# Patient Record
Sex: Female | Born: 1970 | Race: White | Hispanic: No | Marital: Married | Smoking: Never smoker
Health system: Southern US, Community
[De-identification: ages and names within clinical notes are randomized; demographics above are authoritative.]

## PROBLEM LIST (undated history)

## (undated) DIAGNOSIS — E785 Hyperlipidemia, unspecified: Secondary | ICD-10-CM

## (undated) DIAGNOSIS — J189 Pneumonia, unspecified organism: Secondary | ICD-10-CM

## (undated) DIAGNOSIS — Z9289 Personal history of other medical treatment: Secondary | ICD-10-CM

## (undated) DIAGNOSIS — T8859XA Other complications of anesthesia, initial encounter: Secondary | ICD-10-CM

## (undated) DIAGNOSIS — F988 Other specified behavioral and emotional disorders with onset usually occurring in childhood and adolescence: Secondary | ICD-10-CM

## (undated) DIAGNOSIS — I10 Essential (primary) hypertension: Secondary | ICD-10-CM

## (undated) DIAGNOSIS — K589 Irritable bowel syndrome without diarrhea: Secondary | ICD-10-CM

## (undated) DIAGNOSIS — Z87898 Personal history of other specified conditions: Secondary | ICD-10-CM

## (undated) HISTORY — PX: COLONOSCOPY: SHX174

## (undated) HISTORY — DX: Hyperlipidemia, unspecified: E78.5

## (undated) HISTORY — DX: Other specified behavioral and emotional disorders with onset usually occurring in childhood and adolescence: F98.8

## (undated) HISTORY — DX: Irritable bowel syndrome, unspecified: K58.9

## (undated) HISTORY — DX: Essential (primary) hypertension: I10

## (undated) HISTORY — PX: HYSTEROSCOPY WITH D & C: SHX1775

---

## 2012-11-23 ENCOUNTER — Ambulatory Visit: Payer: Self-pay | Admitting: Obstetrics and Gynecology

## 2013-04-06 DIAGNOSIS — N840 Polyp of corpus uteri: Secondary | ICD-10-CM

## 2013-04-06 DIAGNOSIS — N63 Unspecified lump in unspecified breast: Secondary | ICD-10-CM

## 2013-04-06 HISTORY — DX: Unspecified lump in unspecified breast: N63.0

## 2013-04-06 HISTORY — DX: Polyp of corpus uteri: N84.0

## 2013-05-29 ENCOUNTER — Ambulatory Visit: Payer: Self-pay | Admitting: Obstetrics and Gynecology

## 2014-02-21 ENCOUNTER — Ambulatory Visit: Payer: Self-pay | Admitting: Obstetrics and Gynecology

## 2015-07-26 ENCOUNTER — Other Ambulatory Visit: Payer: Self-pay | Admitting: Obstetrics & Gynecology

## 2015-07-26 DIAGNOSIS — Z1239 Encounter for other screening for malignant neoplasm of breast: Secondary | ICD-10-CM

## 2015-08-05 ENCOUNTER — Ambulatory Visit: Admission: RE | Admit: 2015-08-05 | Payer: Self-pay | Source: Ambulatory Visit

## 2015-08-09 ENCOUNTER — Ambulatory Visit
Admission: RE | Admit: 2015-08-09 | Discharge: 2015-08-09 | Disposition: A | Discharge status: Home / Self Care | Payer: 59 | Source: Ambulatory Visit | Attending: Obstetrics & Gynecology | Admitting: Obstetrics & Gynecology

## 2015-08-09 DIAGNOSIS — Z1231 Encounter for screening mammogram for malignant neoplasm of breast: Secondary | ICD-10-CM | POA: Diagnosis present

## 2015-08-09 DIAGNOSIS — Z1239 Encounter for other screening for malignant neoplasm of breast: Secondary | ICD-10-CM

## 2015-08-14 ENCOUNTER — Other Ambulatory Visit: Payer: Self-pay | Admitting: Obstetrics & Gynecology

## 2015-08-14 DIAGNOSIS — R928 Other abnormal and inconclusive findings on diagnostic imaging of breast: Secondary | ICD-10-CM

## 2015-08-26 ENCOUNTER — Ambulatory Visit
Admission: RE | Admit: 2015-08-26 | Discharge: 2015-08-26 | Disposition: A | Discharge status: Home / Self Care | Payer: 59 | Source: Ambulatory Visit | Attending: Obstetrics & Gynecology | Admitting: Obstetrics & Gynecology

## 2015-08-26 DIAGNOSIS — N6489 Other specified disorders of breast: Secondary | ICD-10-CM | POA: Diagnosis present

## 2015-08-26 DIAGNOSIS — R928 Other abnormal and inconclusive findings on diagnostic imaging of breast: Secondary | ICD-10-CM

## 2016-08-03 ENCOUNTER — Other Ambulatory Visit: Payer: Self-pay | Admitting: Obstetrics & Gynecology

## 2016-08-03 DIAGNOSIS — Z1231 Encounter for screening mammogram for malignant neoplasm of breast: Secondary | ICD-10-CM

## 2016-08-28 ENCOUNTER — Ambulatory Visit
Admission: RE | Admit: 2016-08-28 | Discharge: 2016-08-28 | Disposition: A | Discharge status: Home / Self Care | Payer: 59 | Source: Ambulatory Visit | Attending: Obstetrics & Gynecology | Admitting: Obstetrics & Gynecology

## 2016-08-28 DIAGNOSIS — Z1231 Encounter for screening mammogram for malignant neoplasm of breast: Secondary | ICD-10-CM | POA: Diagnosis present

## 2017-08-27 ENCOUNTER — Other Ambulatory Visit: Payer: Self-pay | Admitting: Obstetrics & Gynecology

## 2017-08-27 DIAGNOSIS — Z1231 Encounter for screening mammogram for malignant neoplasm of breast: Secondary | ICD-10-CM

## 2017-09-09 ENCOUNTER — Ambulatory Visit
Admission: RE | Admit: 2017-09-09 | Discharge: 2017-09-09 | Disposition: A | Discharge status: Home / Self Care | Payer: Managed Care, Other (non HMO) | Source: Ambulatory Visit | Attending: Obstetrics & Gynecology | Admitting: Obstetrics & Gynecology

## 2017-09-09 DIAGNOSIS — Z1231 Encounter for screening mammogram for malignant neoplasm of breast: Secondary | ICD-10-CM | POA: Insufficient documentation

## 2017-12-26 ENCOUNTER — Emergency Department
Admission: EM | Admit: 2017-12-26 | Discharge: 2017-12-26 | Disposition: A | Discharge status: Home / Self Care | Payer: Managed Care, Other (non HMO) | Attending: Emergency Medicine | Admitting: Emergency Medicine

## 2017-12-26 ENCOUNTER — Other Ambulatory Visit: Payer: Self-pay

## 2017-12-26 DIAGNOSIS — Y9301 Activity, walking, marching and hiking: Secondary | ICD-10-CM | POA: Diagnosis not present

## 2017-12-26 DIAGNOSIS — W458XXA Other foreign body or object entering through skin, initial encounter: Secondary | ICD-10-CM | POA: Insufficient documentation

## 2017-12-26 DIAGNOSIS — Y999 Unspecified external cause status: Secondary | ICD-10-CM | POA: Diagnosis not present

## 2017-12-26 DIAGNOSIS — S91331A Puncture wound without foreign body, right foot, initial encounter: Secondary | ICD-10-CM | POA: Diagnosis not present

## 2017-12-26 DIAGNOSIS — Y929 Unspecified place or not applicable: Secondary | ICD-10-CM | POA: Insufficient documentation

## 2017-12-26 DIAGNOSIS — Z23 Encounter for immunization: Secondary | ICD-10-CM | POA: Diagnosis not present

## 2017-12-26 MED ORDER — BACITRACIN-NEOMYCIN-POLYMYXIN 400-5-5000 EX OINT
TOPICAL_OINTMENT | Freq: Once | CUTANEOUS | Status: AC
  Administered 2017-12-26: 15:00:00 via TOPICAL

## 2017-12-26 MED ORDER — BACITRACIN-NEOMYCIN-POLYMYXIN 400-5-5000 EX OINT
TOPICAL_OINTMENT | CUTANEOUS | Status: AC
  Filled 2017-12-26: qty 1

## 2017-12-26 MED ORDER — TETANUS-DIPHTH-ACELL PERTUSSIS 5-2.5-18.5 LF-MCG/0.5 IM SUSP
0.5000 mL | Freq: Once | INTRAMUSCULAR | Status: AC
  Administered 2017-12-26: 0.5 mL via INTRAMUSCULAR
  Filled 2017-12-26: qty 0.5

## 2017-12-26 MED ORDER — CIPROFLOXACIN HCL 500 MG PO TABS: 500.0000 mg | ORAL_TABLET | Freq: Two times a day (BID) | ORAL | 0 refills | Status: DC

## 2017-12-26 NOTE — Discharge Instructions (Addendum)
Follow-up with your regular doctor if there is any sign of infection.  Take the medication as prescribed.  You may soak your foot in warm water with Epson salts.  Clean the wound daily with soap and water.  Return to the emergency department if you are worsening

## 2017-12-26 NOTE — ED Triage Notes (Signed)
Pt states she stepped on a piece of rebarb that went through her flip flop and into the arch of her right foot. Bleeding controlled.

## 2017-12-26 NOTE — ED Notes (Signed)
Neosporin dressing applied  

## 2017-12-26 NOTE — ED Provider Notes (Signed)
[facility] Emergency Department Provider Note  ____________________________________________   First MD Initiated Contact with Patient 12/26/17 1503     (approximate)  I have reviewed the triage vital signs and the nursing notes.   HISTORY  Chief Complaint Puncture Wound    HPI Dawn Singh is a 47 y.o. female's emergency department with a puncture wound to the right foot.  She states her tetanus is unknown.  She states that she stepped on a spike that was in the ground that had held a bench.  The spike went through the rubber sole of her Birkenstock and into her foot.  She denies any other injuries    History reviewed. No pertinent past medical history.  There are no active problems to display for this patient.   History reviewed. No pertinent surgical history.  Prior to Admission medications   Medication Sig Start Date End Date Taking? Authorizing Provider  ciprofloxacin (CIPRO) 500 MG tablet Take 1 tablet (500 mg total) by mouth 2 (two) times daily. 12/26/17   Faythe GheeFisher, Avram Danielson W, PA-C    Allergies Amoxicillin  Family History  Problem Relation Age of Onset  . Breast cancer Neg Hx     Social History Social History   Tobacco Use  . Smoking status: Never Smoker  . Smokeless tobacco: Never Used  Substance Use Topics  . Alcohol use: Not Currently  . Drug use: Not Currently    Review of Systems  Constitutional: No fever/chills Eyes: No visual changes. ENT: No sore throat. Respiratory: Denies cough Genitourinary: Negative for dysuria. Musculoskeletal: Negative for back pain. Skin: Negative for rash.  Positive for plantar puncture wound through a shoe    ____________________________________________   PHYSICAL EXAM:  VITAL SIGNS: ED Triage Vitals  Enc Vitals Group     BP 12/26/17 1445 (!) 145/79     Pulse Rate 12/26/17 1445 89     Resp 12/26/17 1445 16     Temp 12/26/17 1445 99.5 F (37.5 C)     Temp Source 12/26/17 1445  Oral     SpO2 12/26/17 1445 97 %     Weight --      Height --      Head Circumference --      Peak Flow --      Pain Score 12/26/17 1440 5     Pain Loc --      Pain Edu? --      Excl. in GC? --     Constitutional: Alert and oriented. Well appearing and in no acute distress. Eyes: Conjunctivae are normal.  Head: Atraumatic. Nose: No congestion/rhinnorhea. Mouth/Throat: Mucous membranes are moist.   Neck:  supple no lymphadenopathy noted Cardiovascular: Normal rate, regular rhythm GU: deferred Musculoskeletal: FROM all extremities, warm and well perfused, positive for a puncture wound to the plantar surface of the right foot.  No foreign body is noted.  Neurovascular is intact Neurologic:  Normal speech and language.  Skin:  Skin is warm, dry , positive for a puncture wound. No rash noted. Psychiatric: Mood and affect are normal. Speech and behavior are normal.  ____________________________________________   LABS (all labs ordered are listed, but only abnormal results are displayed)  Labs Reviewed - No data to display ____________________________________________   ____________________________________________  RADIOLOGY    ____________________________________________   PROCEDURES  Procedure(s) performed: Tdap given Procedures    ____________________________________________   INITIAL IMPRESSION / ASSESSMENT AND PLAN / ED COURSE  Pertinent labs & imaging results that were available  during my care of the patient were reviewed by me and considered in my medical decision making (see chart for details).   Patient is a 47 year old female presents emergency department after a puncture wound to the plantar surface of the right foot while wearing sandals.  On physical exam patient does have a puncture wound.  The shoes she was wearing has a rubber sole with cork base.  No foreign body is noted.  The patient was given a Tdap as she is unsure of her last tetanus.  She  was given a prescription for Cipro due to the concerns of Pseudomonas.  She is to wash the foot daily with soap and water.  She can apply a small amount of antibiotic ointment if desired.  Take the medication as prescribed.  Follow-up with her regular doctor if any sign of infection.  Return emergency department worsening.  She states she understands will comply.  She was discharged in stable condition     As part of my medical decision making, I reviewed the following data within the electronic MEDICAL RECORD NUMBER Nursing notes reviewed and incorporated, Old chart reviewed, Notes from prior ED visits and [place] Controlled Substance Database  ____________________________________________   FINAL CLINICAL IMPRESSION(S) / ED DIAGNOSES  Final diagnoses:  Puncture wound of plantar aspect of foot without complication, right, initial encounter      NEW MEDICATIONS STARTED DURING THIS VISIT:  New Prescriptions   CIPROFLOXACIN (CIPRO) 500 MG TABLET    Take 1 tablet (500 mg total) by mouth 2 (two) times daily.     Note:  This document was prepared using Dragon voice recognition software and may include unintentional dictation errors.    Faythe Ghee, PA-C 12/26/17 1527    Merrily Brittle, MD 12/27/17 [phone]

## 2017-12-26 NOTE — ED Notes (Signed)
Pt stepped on a piece of metal  4 hours ago  Puncture wound  r foot   wound was  Cleaned at the site   Pain is present  Unknown as to last tetanus  Shot but it has been over six  Years

## 2018-10-24 ENCOUNTER — Other Ambulatory Visit: Payer: Self-pay | Admitting: Obstetrics & Gynecology

## 2018-10-24 DIAGNOSIS — Z1231 Encounter for screening mammogram for malignant neoplasm of breast: Secondary | ICD-10-CM

## 2018-12-02 ENCOUNTER — Ambulatory Visit
Admission: RE | Admit: 2018-12-02 | Discharge: 2018-12-02 | Disposition: A | Discharge status: Home / Self Care | Payer: Managed Care, Other (non HMO) | Source: Ambulatory Visit | Attending: Obstetrics & Gynecology | Admitting: Obstetrics & Gynecology

## 2018-12-02 DIAGNOSIS — Z1231 Encounter for screening mammogram for malignant neoplasm of breast: Secondary | ICD-10-CM | POA: Insufficient documentation

## 2018-12-05 ENCOUNTER — Other Ambulatory Visit: Payer: Self-pay | Admitting: Obstetrics & Gynecology

## 2018-12-05 DIAGNOSIS — N6310 Unspecified lump in the right breast, unspecified quadrant: Secondary | ICD-10-CM

## 2018-12-05 DIAGNOSIS — R928 Other abnormal and inconclusive findings on diagnostic imaging of breast: Secondary | ICD-10-CM

## 2018-12-16 ENCOUNTER — Ambulatory Visit
Admission: RE | Admit: 2018-12-16 | Discharge: 2018-12-16 | Disposition: A | Discharge status: Home / Self Care | Payer: Managed Care, Other (non HMO) | Source: Ambulatory Visit | Attending: Obstetrics & Gynecology | Admitting: Obstetrics & Gynecology

## 2018-12-16 DIAGNOSIS — R928 Other abnormal and inconclusive findings on diagnostic imaging of breast: Secondary | ICD-10-CM

## 2018-12-16 DIAGNOSIS — N6310 Unspecified lump in the right breast, unspecified quadrant: Secondary | ICD-10-CM | POA: Diagnosis present

## 2019-05-10 ENCOUNTER — Other Ambulatory Visit: Payer: Self-pay | Admitting: Obstetrics & Gynecology

## 2019-05-10 DIAGNOSIS — Z1239 Encounter for other screening for malignant neoplasm of breast: Secondary | ICD-10-CM

## 2019-05-31 ENCOUNTER — Other Ambulatory Visit: Payer: Self-pay | Admitting: Obstetrics & Gynecology

## 2019-05-31 DIAGNOSIS — N6001 Solitary cyst of right breast: Secondary | ICD-10-CM

## 2019-06-20 ENCOUNTER — Ambulatory Visit
Admission: RE | Admit: 2019-06-20 | Discharge: 2019-06-20 | Disposition: A | Discharge status: Home / Self Care | Payer: Managed Care, Other (non HMO) | Source: Ambulatory Visit | Attending: Obstetrics & Gynecology | Admitting: Obstetrics & Gynecology

## 2019-06-20 DIAGNOSIS — N6001 Solitary cyst of right breast: Secondary | ICD-10-CM

## 2019-06-21 ENCOUNTER — Other Ambulatory Visit: Payer: Self-pay | Admitting: Obstetrics & Gynecology

## 2019-06-21 DIAGNOSIS — N6310 Unspecified lump in the right breast, unspecified quadrant: Secondary | ICD-10-CM

## 2019-06-27 ENCOUNTER — Other Ambulatory Visit: Payer: Self-pay | Admitting: Student

## 2019-06-27 DIAGNOSIS — R197 Diarrhea, unspecified: Secondary | ICD-10-CM

## 2019-06-27 DIAGNOSIS — K625 Hemorrhage of anus and rectum: Secondary | ICD-10-CM

## 2019-06-27 DIAGNOSIS — R1084 Generalized abdominal pain: Secondary | ICD-10-CM

## 2019-07-18 ENCOUNTER — Ambulatory Visit: Admission: RE | Admit: 2019-07-18 | Payer: Managed Care, Other (non HMO) | Source: Ambulatory Visit

## 2019-08-03 ENCOUNTER — Ambulatory Visit
Admission: RE | Admit: 2019-08-03 | Discharge: 2019-08-03 | Disposition: A | Discharge status: Home / Self Care | Payer: Managed Care, Other (non HMO) | Source: Ambulatory Visit | Attending: Student | Admitting: Student

## 2019-08-03 ENCOUNTER — Other Ambulatory Visit: Payer: Self-pay

## 2019-08-03 DIAGNOSIS — R1084 Generalized abdominal pain: Secondary | ICD-10-CM | POA: Diagnosis not present

## 2019-08-03 DIAGNOSIS — R197 Diarrhea, unspecified: Secondary | ICD-10-CM | POA: Diagnosis present

## 2019-08-03 DIAGNOSIS — K625 Hemorrhage of anus and rectum: Secondary | ICD-10-CM

## 2019-08-03 MED ORDER — IOHEXOL 350 MG/ML SOLN
100.0000 mL | Freq: Once | INTRAVENOUS | Status: AC | PRN
  Administered 2019-08-03: 100 mL via INTRAVENOUS

## 2019-12-27 ENCOUNTER — Ambulatory Visit
Admission: RE | Admit: 2019-12-27 | Discharge: 2019-12-27 | Disposition: A | Discharge status: Home / Self Care | Payer: Managed Care, Other (non HMO) | Source: Ambulatory Visit | Attending: Obstetrics & Gynecology | Admitting: Obstetrics & Gynecology

## 2019-12-27 ENCOUNTER — Other Ambulatory Visit: Payer: Self-pay

## 2019-12-27 DIAGNOSIS — N6310 Unspecified lump in the right breast, unspecified quadrant: Secondary | ICD-10-CM | POA: Diagnosis present

## 2020-01-23 MED ORDER — LIDOCAINE HCL (PF) 2 % IJ SOLN
INTRAMUSCULAR | Status: AC
  Filled 2020-01-23: qty 5

## 2020-01-23 NOTE — H&P (Signed)
Chief Complaint:   Patient ID: Dawn Singh is a 49 y.o. female presenting with Pre-op Exam  on 01/22/2020  HPI: She is an established patient who presented for evaluation of her dysfunctional uterine bleeding in August 2021. History of normal and regular cycles, but for 8 weeks had continuous bright red spotting requiring panty liner. On ultrasound, her endometrium was 22 mm in thickness with possible endometrial fibroids. She will need a diagnostic D&C with hysteroscopy to have this tissue removed.  She would like a progestin IUD placed at time of this procedure.    Returns today for preoperative exam.   Today: Bleeding has returned, controlled light spotting but mostly clots. Starting to have hot flashes.  Had negative anesthesia experience after colonoscopy; did not following her prior D&C.     Workup:   TVUS 12/07/19: Uterus anteverted 9.5 x 5.5. x 6 cm  EE 22 mm With 2 Fibroids Seen  Lateral posterior 1.5 cm  Anterior 1.5 cm  With possible endometrial polyp: 4 x 2.5 x 3.5 cm LO 3.5 x 2.5 x 2.5 cm with 2 simple cysts: 2.5 cm and 1.5 cm  RO 4 x 2.5 x 2 cm with complex cyst 3 cm  Free Fluid Seen near both ovaries      Past Medical History:  has a past medical history of Breast mass (2015), Endometrial polyp (02/07/2014), H/O abnormal mammogram (09/2011), and History of Papanicolaou smear of cervix (02/07/2014).  Past Surgical History:  has a past surgical history that includes Dilation and curettage of uterus and Colonoscopy (04/04/2019). Family History: family history includes Breast cancer in her paternal aunt; Hyperlipidemia (Elevated cholesterol) in her father; Osteoporosis (Thinning of bones) in her mother; Other in her father. Social History:  reports that she has never smoked. She has never used smokeless tobacco. She reports that she does not drink alcohol and does not use drugs. OB/GYN History:  OB History    Gravida  3   Para  2   Term  2   Preterm       AB  1   Living  2     SAB  1   IAB      Ectopic      Molar      Multiple      Live Births  2          Allergies: is allergic to amoxicillin. Medications:  Current Outpatient Medications:  .  B-complex with vitamin C (VITAMIN B COMPLEX-C ORAL), Take by mouth, Disp: , Rfl:  .  cholecalciferol (CHOLECALCIFEROL) 1000 unit tablet, Take 5,000 Units by mouth once daily, Disp: , Rfl:  .  Lactobac no.41/Bifidobact no.7 (PROBIOTIC-10 ORAL), Take by mouth once daily   , Disp: , Rfl:  .  medroxyPROGESTERone (PROVERA) 10 MG tablet, 2 tabs in AM 2 tab in PM until bleeding stops.Then,2 tabs in Am 1 tab in PM for 4 days.Then 1 tab in AM 1 Tab in PM for 4 days.Then 1 tab daily, Disp: 60 tablet, Rfl: 3 .  melatonin 3 mg Cap, Take by mouth once daily   , Disp: , Rfl:  .  omega-3 fatty acids/fish oil (FISH OIL EXTRA STRENGTH ORAL), Take by mouth once daily   , Disp: , Rfl:    Review of Systems: No SOB, no palpitations or chest pain, no new lower extremity edema, no nausea or vomiting or bowel or bladder complaints. See HPI for gyn specific ROS.   Exam:    Constitutional:  BP 146/78   Ht 170.2 cm (5\' 7" )   Wt 95.5 kg (210 lb 9.6 oz)   LMP 01/07/2020   BMI 32.98 kg/m   WDWN female in NAD   HEENT: sclera clear, non-icteric, moist mucous membranes, dentition intact Endocrine:  no thyromegaly Respiratory: normal respiratory effort, CTABL    CV: no peripheral edema, RRR no MRG GI: soft , no mass, non-tender, no rebound tenderness  GU: tanner stage 5 ,              External genitalia/skin: vulva /labia no lesions             Lymphatic: no enlarged inguinal nodes bilaterally             Urethra: no prolapse, no diverticulum, no caruncle             Bladder: no tenderness to palpation, no cystocele             Vagina: normal physiologic d/c, no lesions, normal apical support             Cervix: no lesions, no cervical motion tenderness, prominent view of the anterior endocervix               Uterus: normal size shape and contour, non-tender, mobile             Adnexa: no masses bilaterally, non-tender   Skin: warm and well perfused, no rashes Neuro: alert, oriented x3,   Psych: appropriate mood and insight, judgement intact   Impression:   The primary encounter diagnosis was Preoperative exam for gynecologic surgery. Diagnoses of DUB (dysfunctional uterine bleeding), Thickened endometrium, Endometrial polyp, and Menometrorrhagia were also pertinent to this visit.  Plan:   Menometrorrhagia with Thickened Endometrium and probable endometrial polyp -currently spotting lightly, reviewed option to change hormonal control, but current spotting and hot flushing is not bothersome to her; continue with provera as prescribed -discussed risks and benefits of IUD insertion or endometrial ablation in addition to procedure -she desires IUD insertion -preoperative exam today -will send products of D&C to pathology  -referral to anesthesiology prior to procedure due to possible h/o adverse reaction -Planned procedure: Dilation and Curettage with Hysteroscopy and IUD insertion   The patient and I discussed the technical aspects of the procedure including the potential for risks and complications.These include but are not limited to the risk of infection requiring post-operative antibiotics or further procedures.We talked about the risk of injury to adjacent organs including bladder, perforation and scarring of the uterus, damage to blood vessels or nerves, the need to convert to a laparoscopy or laparotomy, possibleneed for blood transfusion andpostop complications such asthromboembolic or cardiopulmonary complications.All of her questions were answered. Her preoperative exam was completed andthe appropriate consents were signed. She is scheduled to undergo this procedure in the near future.   I personally performed the service. (TP)  Cynthie Garmon CRIST Dawn Bansal, MD   A portion of  this note was dictated via scribe.  The plan and decision making are mine.   Errors are unintentional.

## 2020-01-25 ENCOUNTER — Other Ambulatory Visit: Payer: Self-pay | Admitting: Obstetrics & Gynecology

## 2020-01-26 MED ORDER — PROPOFOL 500 MG/50ML IV EMUL
INTRAVENOUS | Status: AC
  Filled 2020-01-26: qty 200

## 2020-02-05 ENCOUNTER — Encounter
Admission: RE | Admit: 2020-02-05 | Discharge: 2020-02-05 | Disposition: A | Discharge status: Home / Self Care | Payer: Managed Care, Other (non HMO) | Source: Ambulatory Visit | Attending: Obstetrics & Gynecology | Admitting: Obstetrics & Gynecology

## 2020-02-05 HISTORY — DX: Personal history of other specified conditions: Z87.898

## 2020-02-05 HISTORY — DX: Personal history of other medical treatment: Z92.89

## 2020-02-05 HISTORY — DX: Other complications of anesthesia, initial encounter: T88.59XA

## 2020-02-05 HISTORY — DX: Pneumonia, unspecified organism: J18.9

## 2020-02-05 NOTE — Patient Instructions (Addendum)
Your procedure is scheduled on: 02/12/20- Monday Report to Day Surgery on the 2nd floor of the [facility]. To find out your arrival time, please call [phone] between 1PM - 3PM on: 02/09/20- Friday  REMEMBER: Instructions that are not followed completely may result in serious medical risk, up to and including death; or upon the discretion of your surgeon and anesthesiologist your surgery may need to be rescheduled.  Do not eat food after midnight the night before surgery.  No gum chewing, lozengers or hard candies.  You may however, drink CLEAR liquids up to 2 hours before you are scheduled to arrive for your surgery. Do not drink anything within 2 hours of your scheduled arrival time.  Clear liquids include: - water  - apple juice without pulp - gatorade (not RED, PURPLE, OR BLUE) - black coffee or tea (Do NOT add milk or creamers to the coffee or tea) Do NOT drink anything that is not on this list.   In addition, your doctor has ordered for you to drink the provided  Ensure Pre-Surgery Clear Carbohydrate Drink  Drinking this carbohydrate drink up to two hours before surgery helps to reduce insulin resistance and improve patient outcomes. Please complete drinking 2 hours prior to scheduled arrival time.  TAKE THESE MEDICATIONS THE MORNING OF SURGERY WITH A SIP OF WATER: - medroxyPROGESTERone (PROVERA) 10 MG tablet     One week prior to surgery: Stop taking 02/05/20, may resume the day after surgery. Stop Anti-inflammatories (NSAIDS) such as Advil, Aleve, Ibuprofen, Motrin, Naproxen, Naprosyn and Aspirin based products such as Excedrin, Goodys Powder, BC Powder. Stop ANY OVER THE COUNTER supplements until after surgery. (You may continue taking Tylenol, Vitamin D, Vitamin B, and multivitamin.)  No Alcohol for 24 hours before or after surgery.  No Smoking including e-cigarettes for 24 hours prior to surgery.  No chewable tobacco products for at least 6 hours prior to  surgery.  No nicotine patches on the day of surgery.  Do not use any "recreational" drugs for at least a week prior to your surgery.  Please be advised that the combination of cocaine and anesthesia may have negative outcomes, up to and including death. If you test positive for cocaine, your surgery will be cancelled.  On the morning of surgery brush your teeth with toothpaste and water, you may rinse your mouth with mouthwash if you wish. Do not swallow any toothpaste or mouthwash.  Do not wear jewelry, make-up, hairpins, clips or nail polish.  Do not wear lotions, powders, or perfumes.   Do not shave 48 hours prior to surgery.   Contact lenses, hearing aids and dentures may not be worn into surgery.  Do not bring valuables to the hospital. [facility] is not responsible for any missing/lost belongings or valuables.   Use CHG Soap or wipes as directed on instruction sheet.  Notify your doctor if there is any change in your medical condition (cold, fever, infection).  Wear comfortable clothing (specific to your surgery type) to the hospital.  Plan for stool softeners for home use; pain medications have a tendency to cause constipation. You can also help prevent constipation by eating foods high in fiber such as fruits and vegetables and drinking plenty of fluids as your diet allows.  After surgery, you can help prevent lung complications by doing breathing exercises.  Take deep breaths and cough every 1-2 hours. Your doctor may order a device called an Incentive Spirometer to help you take deep breaths. When  coughing or sneezing, hold a pillow firmly against your incision with both hands. This is called "splinting." Doing this helps protect your incision. It also decreases belly discomfort.  If you are being admitted to the hospital overnight, leave your suitcase in the car. After surgery it may be brought to your room.  If you are being discharged the day of surgery, you will not  be allowed to drive home. You will need a responsible adult (18 years or older) to drive you home and stay with you that night.   If you are taking public transportation, you will need to have a responsible adult (18 years or older) with you. Please confirm with your physician that it is acceptable to use public transportation.   Please call the Pre-admissions Testing Dept. at [phone] if you have any questions about these instructions.  Visitation Policy:  Patients undergoing a surgery or procedure may have one family member or support person with them as long as that person is not COVID-19 positive or experiencing its symptoms.  That person may remain in the waiting area during the procedure.  Inpatient Visitation Update:   In an effort to ensure the safety of our team members and our patients, we are implementing a change to our visitation policy:  Effective Monday, Aug. 9, at 7 a.m., inpatients will be allowed one support person.  o The support person may change daily.  o The support person must pass our screening, gel in and out, and wear a mask at all times, including in the patient's room.  o Patients must also wear a mask when staff or their support person are in the room.  o Masking is required regardless of vaccination status.  Systemwide, no visitors 17 or younger.

## 2020-02-08 ENCOUNTER — Other Ambulatory Visit
Admission: RE | Admit: 2020-02-08 | Discharge: 2020-02-08 | Disposition: A | Discharge status: Home / Self Care | Payer: Managed Care, Other (non HMO) | Source: Ambulatory Visit | Attending: Obstetrics & Gynecology | Admitting: Obstetrics & Gynecology

## 2020-02-08 DIAGNOSIS — Z20822 Contact with and (suspected) exposure to covid-19: Secondary | ICD-10-CM | POA: Insufficient documentation

## 2020-02-08 DIAGNOSIS — Z01818 Encounter for other preprocedural examination: Secondary | ICD-10-CM | POA: Diagnosis present

## 2020-02-08 LAB — TYPE AND SCREEN
ABO/RH(D): O NEG
Antibody Screen: NEGATIVE

## 2020-02-08 LAB — BASIC METABOLIC PANEL
Anion gap: 8 (ref 5–15)
BUN: 16 mg/dL (ref 6–20)
CO2: 28 mmol/L (ref 22–32)
Calcium: 8.9 mg/dL (ref 8.9–10.3)
Chloride: 100 mmol/L (ref 98–111)
Creatinine, Ser: 0.75 mg/dL (ref 0.44–1.00)
GFR, Estimated: >60 mL/min (ref >60)
Glucose, Bld: 107 mg/dL — ABNORMAL HIGH (ref 70–99)
Potassium: 3.9 mmol/L (ref 3.5–5.1)
Sodium: 136 mmol/L (ref 135–145)

## 2020-02-08 LAB — CBC
HCT: 42 % (ref 36.0–46.0)
Hemoglobin: 14.1 g/dL (ref 12.0–15.0)
MCH: 29.9 pg (ref 26.0–34.0)
MCHC: 33.6 g/dL (ref 30.0–36.0)
MCV: 89 fL (ref 80.0–100.0)
Platelets: 209 10*3/uL (ref 150–400)
RBC: 4.72 MIL/uL (ref 3.87–5.11)
RDW: 13.1 % (ref 11.5–15.5)
WBC: 6.6 10*3/uL (ref 4.0–10.5)
nRBC: 0 % (ref 0.0–0.2)

## 2020-02-08 LAB — SARS CORONAVIRUS 2 (TAT 6-24 HRS): SARS Coronavirus 2: NEGATIVE

## 2020-02-11 MED ORDER — KETOROLAC TROMETHAMINE 15 MG/ML IJ SOLN: 15.0000 mg | INTRAMUSCULAR | Status: AC

## 2020-02-11 MED ORDER — LEVONORGESTREL 20 MCG/24HR IU IUD
INTRAUTERINE_SYSTEM | INTRAUTERINE | Status: AC
  Administered 2020-02-12: 1 via INTRAUTERINE
  Filled 2020-02-11: qty 1

## 2020-02-11 MED ORDER — CHLORHEXIDINE GLUCONATE 0.12 % MT SOLN: 15.0000 mL | Freq: Once | OROMUCOSAL | Status: AC

## 2020-02-11 MED ORDER — ORAL CARE MOUTH RINSE: 15.0000 mL | Freq: Once | OROMUCOSAL | Status: AC

## 2020-02-11 MED ORDER — GABAPENTIN 300 MG PO CAPS: 300.0000 mg | ORAL_CAPSULE | ORAL | Status: AC

## 2020-02-11 MED ORDER — LACTATED RINGERS IV SOLN: INTRAVENOUS | Status: DC

## 2020-02-11 MED ORDER — HEPARIN SODIUM (PORCINE) 5000 UNIT/ML IJ SOLN: 5000.0000 [IU] | INTRAMUSCULAR | Status: AC

## 2020-02-11 MED ORDER — FAMOTIDINE 20 MG PO TABS: 20.0000 mg | ORAL_TABLET | Freq: Once | ORAL | Status: AC

## 2020-02-11 MED ORDER — ACETAMINOPHEN 500 MG PO TABS: 1000.0000 mg | ORAL_TABLET | ORAL | Status: AC

## 2020-02-11 MED ORDER — POVIDONE-IODINE 10 % EX SWAB: 2.0000 "application " | Freq: Once | CUTANEOUS | Status: DC

## 2020-02-11 MED ORDER — SCOPOLAMINE 1 MG/3DAYS TD PT72: 1.0000 | MEDICATED_PATCH | TRANSDERMAL | Status: DC

## 2020-02-12 ENCOUNTER — Ambulatory Visit: Payer: Managed Care, Other (non HMO) | Admitting: Certified Registered"

## 2020-02-12 ENCOUNTER — Other Ambulatory Visit: Payer: Self-pay

## 2020-02-12 ENCOUNTER — Encounter
Admission: RE | Disposition: A | Discharge status: Home / Self Care | Payer: Self-pay | Source: Home / Self Care | Attending: Obstetrics & Gynecology

## 2020-02-12 ENCOUNTER — Ambulatory Visit
Admission: RE | Admit: 2020-02-12 | Discharge: 2020-02-12 | Disposition: A | Discharge status: Home / Self Care | Payer: Managed Care, Other (non HMO) | Attending: Obstetrics & Gynecology | Admitting: Obstetrics & Gynecology

## 2020-02-12 ENCOUNTER — Encounter: Payer: Self-pay | Admitting: Obstetrics & Gynecology

## 2020-02-12 DIAGNOSIS — N938 Other specified abnormal uterine and vaginal bleeding: Secondary | ICD-10-CM | POA: Insufficient documentation

## 2020-02-12 DIAGNOSIS — Z880 Allergy status to penicillin: Secondary | ICD-10-CM | POA: Insufficient documentation

## 2020-02-12 DIAGNOSIS — R9389 Abnormal findings on diagnostic imaging of other specified body structures: Secondary | ICD-10-CM | POA: Diagnosis not present

## 2020-02-12 HISTORY — PX: INTRAUTERINE DEVICE (IUD) INSERTION: SHX5877

## 2020-02-12 HISTORY — PX: HYSTEROSCOPY WITH D & C: SHX1775

## 2020-02-12 LAB — POCT PREGNANCY, URINE: Preg Test, Ur: NEGATIVE

## 2020-02-12 LAB — ABO/RH: ABO/RH(D): O NEG

## 2020-02-12 SURGERY — DILATATION AND CURETTAGE /HYSTEROSCOPY
Anesthesia: General

## 2020-02-12 MED ORDER — HEPARIN SODIUM (PORCINE) 5000 UNIT/ML IJ SOLN
INTRAMUSCULAR | Status: AC
  Administered 2020-02-12: 5000 [IU] via SUBCUTANEOUS
  Filled 2020-02-12: qty 1

## 2020-02-12 MED ORDER — LEVONORGESTREL 20 MCG/24HR IU IUD
INTRAUTERINE_SYSTEM | INTRAUTERINE | Status: AC
  Filled 2020-02-12: qty 1

## 2020-02-12 MED ORDER — FAMOTIDINE 20 MG PO TABS
ORAL_TABLET | ORAL | Status: AC
  Administered 2020-02-12: 20 mg via ORAL
  Filled 2020-02-12: qty 1

## 2020-02-12 MED ORDER — EPHEDRINE SULFATE 50 MG/ML IJ SOLN
INTRAMUSCULAR | Status: DC | PRN
  Administered 2020-02-12: 20 mg via INTRAVENOUS

## 2020-02-12 MED ORDER — FENTANYL CITRATE (PF) 100 MCG/2ML IJ SOLN
INTRAMUSCULAR | Status: DC | PRN
  Administered 2020-02-12 (×2): 50 ug via INTRAVENOUS

## 2020-02-12 MED ORDER — DEXAMETHASONE SODIUM PHOSPHATE 10 MG/ML IJ SOLN
INTRAMUSCULAR | Status: DC | PRN
  Administered 2020-02-12: 10 mg via INTRAVENOUS

## 2020-02-12 MED ORDER — LIDOCAINE HCL (CARDIAC) PF 100 MG/5ML IV SOSY
PREFILLED_SYRINGE | INTRAVENOUS | Status: DC | PRN
  Administered 2020-02-12: 80 mg via INTRAVENOUS

## 2020-02-12 MED ORDER — ONDANSETRON HCL 4 MG/2ML IJ SOLN
INTRAMUSCULAR | Status: DC | PRN
Start: 2020-02-12 — End: 2020-02-12
  Administered 2020-02-12: 4 mg via INTRAVENOUS

## 2020-02-12 MED ORDER — GABAPENTIN 300 MG PO CAPS
ORAL_CAPSULE | ORAL | Status: AC
  Administered 2020-02-12: 300 mg via ORAL
  Filled 2020-02-12: qty 1

## 2020-02-12 MED ORDER — ONDANSETRON HCL 4 MG/2ML IJ SOLN
INTRAMUSCULAR | Status: AC
  Filled 2020-02-12: qty 2

## 2020-02-12 MED ORDER — SCOPOLAMINE 1 MG/3DAYS TD PT72
MEDICATED_PATCH | TRANSDERMAL | Status: AC
  Administered 2020-02-12: 1.5 mg via TRANSDERMAL
  Filled 2020-02-12: qty 1

## 2020-02-12 MED ORDER — MIDAZOLAM HCL 2 MG/2ML IJ SOLN
INTRAMUSCULAR | Status: AC
  Filled 2020-02-12: qty 2

## 2020-02-12 MED ORDER — KETOROLAC TROMETHAMINE 15 MG/ML IJ SOLN
INTRAMUSCULAR | Status: AC
  Administered 2020-02-12: 15 mg via INTRAVENOUS
  Filled 2020-02-12: qty 1

## 2020-02-12 MED ORDER — CHLORHEXIDINE GLUCONATE 0.12 % MT SOLN
OROMUCOSAL | Status: AC
  Administered 2020-02-12: 15 mL via OROMUCOSAL
  Filled 2020-02-12: qty 15

## 2020-02-12 MED ORDER — EPHEDRINE 5 MG/ML INJ
INTRAVENOUS | Status: AC
  Filled 2020-02-12: qty 10

## 2020-02-12 MED ORDER — ACETAMINOPHEN 500 MG PO TABS
ORAL_TABLET | ORAL | Status: AC
  Administered 2020-02-12: 1000 mg via ORAL
  Filled 2020-02-12: qty 2

## 2020-02-12 MED ORDER — DEXAMETHASONE SODIUM PHOSPHATE 10 MG/ML IJ SOLN
INTRAMUSCULAR | Status: AC
  Filled 2020-02-12: qty 1

## 2020-02-12 MED ORDER — PROPOFOL 10 MG/ML IV BOLUS
INTRAVENOUS | Status: DC | PRN
Start: 2020-02-12 — End: 2020-02-12
  Administered 2020-02-12: 250 mg via INTRAVENOUS

## 2020-02-12 MED ORDER — PENTAFLUOROPROP-TETRAFLUOROETH EX AERO
INHALATION_SPRAY | CUTANEOUS | Status: AC
  Filled 2020-02-12: qty 30

## 2020-02-12 MED ORDER — LIDOCAINE HCL (PF) 2 % IJ SOLN
INTRAMUSCULAR | Status: AC
  Filled 2020-02-12: qty 5

## 2020-02-12 MED ORDER — FENTANYL CITRATE (PF) 100 MCG/2ML IJ SOLN: 25.0000 ug | INTRAMUSCULAR | Status: DC | PRN

## 2020-02-12 MED ORDER — FENTANYL CITRATE (PF) 100 MCG/2ML IJ SOLN
INTRAMUSCULAR | Status: AC
  Filled 2020-02-12: qty 2

## 2020-02-12 MED ORDER — OXYCODONE HCL 5 MG PO TABS: 5.0000 mg | ORAL_TABLET | Freq: Once | ORAL | Status: DC | PRN

## 2020-02-12 MED ORDER — MIDAZOLAM HCL 2 MG/2ML IJ SOLN
INTRAMUSCULAR | Status: DC | PRN
  Administered 2020-02-12: 2 mg via INTRAVENOUS

## 2020-02-12 MED ORDER — OXYCODONE HCL 5 MG/5ML PO SOLN: 5.0000 mg | Freq: Once | ORAL | Status: DC | PRN

## 2020-02-12 MED ORDER — PROPOFOL 10 MG/ML IV BOLUS
INTRAVENOUS | Status: AC
  Filled 2020-02-12: qty 20

## 2020-02-12 SURGICAL SUPPLY — 23 items
CATH ROBINSON RED A/P 16FR (CATHETERS) ×3 IMPLANT
COVER WAND RF STERILE (DRAPES) ×3 IMPLANT
DEVICE MYOSURE LITE (MISCELLANEOUS) IMPLANT
DEVICE MYOSURE REACH (MISCELLANEOUS) IMPLANT
ELECT REM PT RETURN 9FT ADLT (ELECTROSURGICAL) ×3
ELECTRODE REM PT RTRN 9FT ADLT (ELECTROSURGICAL) ×1 IMPLANT
GLOVE PI ORTHOPRO 6.5 (GLOVE) ×2
GLOVE PI ORTHOPRO STRL 6.5 (GLOVE) ×1 IMPLANT
GLOVE SURG SYN 6.5 ES PF (GLOVE) ×6 IMPLANT
GOWN STRL REUS W/ TWL LRG LVL3 (GOWN DISPOSABLE) ×2 IMPLANT
GOWN STRL REUS W/TWL LRG LVL3 (GOWN DISPOSABLE) ×6
IV LACTATED RINGERS 1000ML (IV SOLUTION) ×3 IMPLANT
KIT PROCEDURE FLUENT (KITS) IMPLANT
KIT TURNOVER CYSTO (KITS) ×3 IMPLANT
MANIFOLD NEPTUNE II (INSTRUMENTS) ×3 IMPLANT
Mirena 52mg ×3 IMPLANT
PACK DNC HYST (MISCELLANEOUS) ×3 IMPLANT
PAD OB MATERNITY 4.3X12.25 (PERSONAL CARE ITEMS) ×3 IMPLANT
PAD PREP 24X41 OB/GYN DISP (PERSONAL CARE ITEMS) ×3 IMPLANT
SEAL ROD LENS SCOPE MYOSURE (ABLATOR) ×3 IMPLANT
TOWEL OR 17X26 4PK STRL BLUE (TOWEL DISPOSABLE) ×3 IMPLANT
TUBING CONNECTING 10 (TUBING) ×2 IMPLANT
TUBING CONNECTING 10' (TUBING) ×1

## 2020-02-12 NOTE — Anesthesia Postprocedure Evaluation (Signed)
Anesthesia Post Note  Patient: Dawn Singh  Procedure(s) Performed: DILATATION AND CURETTAGE /HYSTEROSCOPY (N/A ) INTRAUTERINE DEVICE (IUD) INSERTION (N/A )  Patient location during evaluation: PACU Anesthesia Type: General Level of consciousness: awake and alert Pain management: pain level controlled Vital Signs Assessment: post-procedure vital signs reviewed and stable Respiratory status: spontaneous breathing, nonlabored ventilation, respiratory function stable and patient connected to nasal cannula oxygen Cardiovascular status: blood pressure returned to baseline and stable Postop Assessment: no apparent nausea or vomiting Anesthetic complications: no   No complications documented.   Last Vitals:  Vitals:   02/12/20 1050 02/12/20 1156  BP: (!) 143/83 136/72  Pulse: 63 (!) 54  Resp: 18 16  Temp: (!) 36.1 C   SpO2: 98% 100%    Last Pain:  Vitals:   02/12/20 1156  TempSrc:   PainSc: 0-No pain                 Cleda Mccreedy Edsel Shives

## 2020-02-12 NOTE — Discharge Instructions (Signed)
You should expect to have some cramping and vaginal bleeding for about a week. This should taper off and subside, much like a period. If heavy bleeding continues or gets worse, you should contact the office for an earlier appointment.   Please call the office or physician on call for fever >101, severe pain, and heavy bleeding.   [phone]  NOTHING IN THE VAGINA FOR 2 WEEKS!!  Dr. Ward will discuss pathology results with you at your postop visit.    AMBULATORY SURGERY  DISCHARGE INSTRUCTIONS   1) The drugs that you were given will stay in your system until tomorrow so for the next 24 hours you should not:  A) Drive an automobile B) Make any legal decisions C) Drink any alcoholic beverage   2) You may resume regular meals tomorrow.  Today it is better to start with liquids and gradually work up to solid foods.  You may eat anything you prefer, but it is better to start with liquids, then soup and crackers, and gradually work up to solid foods.   3) Please notify your doctor immediately if you have any unusual bleeding, trouble breathing, redness and pain at the surgery site, drainage, fever, or pain not relieved by medication.    4) Additional Instructions:        Please contact your physician with any problems or Same Day Surgery at [phone], Monday through Friday 6 am to 4 pm, or [facility] Main number at [phone].  

## 2020-02-12 NOTE — Anesthesia Procedure Notes (Signed)
Procedure Name: LMA Insertion °Performed by: Clemmie Marxen R, CRNA °Pre-anesthesia Checklist: Patient identified, Emergency Drugs available, Suction available and Patient being monitored °Patient Re-evaluated:Patient Re-evaluated prior to induction °Oxygen Delivery Method: Circle system utilized °Preoxygenation: Pre-oxygenation with 100% oxygen °Induction Type: IV induction °LMA: LMA inserted °LMA Size: 4.0 °Tube type: Oral °Number of attempts: 1 °Placement Confirmation: positive ETCO2 and breath sounds checked- equal and bilateral °Dental Injury: Teeth and Oropharynx as per pre-operative assessment  ° ° ° ° ° ° °

## 2020-02-12 NOTE — Interval H&P Note (Signed)
History and Physical Interval Note:  02/12/2020 8:24 AM  Dawn Singh  has presented today for surgery, with the diagnosis of dysfunctional uterine bleeding, thickened endometrium.  The various methods of treatment have been discussed with the patient and family. After consideration of risks, benefits and other options for treatment, the patient has consented to  Procedure(s): DILATATION AND CURETTAGE /HYSTEROSCOPY (N/A) INTRAUTERINE DEVICE (IUD) INSERTION (N/A) as a surgical intervention.  The patient's history has been reviewed, patient examined, no change in status, stable for surgery.  I have reviewed the patient's chart and labs.  Questions were answered to the patient's satisfaction.     Milca Sytsma C Doloros Kwolek

## 2020-02-12 NOTE — Transfer of Care (Signed)
Immediate Anesthesia Transfer of Care Note  Patient: Dawn Singh  Procedure(s) Performed: DILATATION AND CURETTAGE /HYSTEROSCOPY (N/A ) INTRAUTERINE DEVICE (IUD) INSERTION (N/A )  Patient Location: PACU  Anesthesia Type:General  Level of Consciousness: awake and drowsy  Airway & Oxygen Therapy: Patient Spontanous Breathing and Patient connected to face mask oxygen  Post-op Assessment: Report given to RN and Post -op Vital signs reviewed and stable  Post vital signs: Reviewed and stable  Last Vitals:  Vitals Value Taken Time  BP 136/68 02/12/20 0954  Temp 36.8 C 02/12/20 0954  Pulse 63 02/12/20 0955  Resp 13 02/12/20 0955  SpO2 100 % 02/12/20 0955  Vitals shown include unvalidated device data.  Last Pain:  Vitals:   02/12/20 0954  TempSrc:   PainSc: 0-No pain         Complications: No complications documented.

## 2020-02-12 NOTE — Anesthesia Preprocedure Evaluation (Addendum)
Anesthesia Evaluation  Patient identified by MRN, date of birth, ID band Patient awake    Reviewed: Allergy & Precautions, H&P , NPO status , Patient's Chart, lab work & pertinent test results  History of Anesthesia Complications (+) history of anesthetic complications  Airway Mallampati: II  TM Distance: >3 FB Neck ROM: full    Dental  (+) Chipped   Pulmonary neg pneumonia ,    Pulmonary exam normal        Cardiovascular (-) hypertension(-) angina(-) Past MI negative cardio ROS Normal cardiovascular exam     Neuro/Psych negative neurological ROS  negative psych ROS   GI/Hepatic negative GI ROS, Neg liver ROS, neg GERD  ,  Endo/Other  negative endocrine ROS  Renal/GU      Musculoskeletal   Abdominal   Peds  Hematology negative hematology ROS (+)   Anesthesia Other Findings Past Medical History: 2015: Breast mass in female No date: Complication of anesthesia     Comment:  very restless after procedure 2015: Endometrial polyp No date: H/O abnormal mammogram No date: History of Papanicolaou smear of cervix 2009 h1n1: Pneumonia  Past Surgical History: No date: COLONOSCOPY No date: HYSTEROSCOPY WITH D & C  BMI    Body Mass Index: 32.11 kg/m      Reproductive/Obstetrics negative OB ROS                            Anesthesia Physical Anesthesia Plan  ASA: II  Anesthesia Plan: General LMA   Post-op Pain Management:    Induction: Intravenous  PONV Risk Score and Plan: Dexamethasone, Ondansetron, Midazolam and Treatment may vary due to age or medical condition  Airway Management Planned: LMA  Additional Equipment:   Intra-op Plan:   Post-operative Plan: Extubation in OR  Informed Consent: I have reviewed the patients History and Physical, chart, labs and discussed the procedure including the risks, benefits and alternatives for the proposed anesthesia with the patient or  authorized representative who has indicated his/her understanding and acceptance.     Dental Advisory Given  Plan Discussed with: Anesthesiologist, CRNA and Surgeon  Anesthesia Plan Comments: (Patient consented for risks of anesthesia including but not limited to:  - adverse reactions to medications - damage to eyes, teeth, lips or other oral mucosa - nerve damage due to positioning  - sore throat or hoarseness - Damage to heart, brain, nerves, lungs, other parts of body or loss of life  Patient voiced understanding.)       Anesthesia Quick Evaluation

## 2020-02-12 NOTE — Op Note (Signed)
Operative Report Hysteroscopy, Dilation and Curettage 02/12/2020  Patient:  Dawn Singh  49 y.o. female Preoperative diagnosis:  dysfunctional uterine bleeding, thickened endometrium Postoperative diagnosis:  dysfunctional uterine bleeding, thickened endometrium  PROCEDURE:  Procedure(s): DILATATION AND CURETTAGE /HYSTEROSCOPY (N/A) INTRAUTERINE DEVICE (IUD) INSERTION (N/A) Surgeon:  Surgeon(s) and Role:    * Derrick Orris, Elenora Fender, MD - Primary Anesthesia:  LMA I/O: Total I/O In: 1049 [I.V.:1049] Out: 2 [Blood:2] Specimens:  Endometrial curettings Complications: None Apparent Disposition:  VS stable to PACU  Findings: Blood in vaginal vault, from cervix. Uterus, mobile, normal size, sounding to 10 cm; normal cervix, vagina, perineum.  Hysteroscopic: posterior uterus with cobblestone-type tissue along the surface. Patent bilateral tubal ostea.  Indication for procedure/Consents: 49 y.o.  here for scheduled surgery for the aforementioned diagnoses.  Risks of surgery were discussed with the patient including but not limited to: bleeding which may require transfusion; infection which may require antibiotics; injury to uterus or surrounding organs; intrauterine scarring which may impair future fertility; need for additional procedures including laparotomy or laparoscopy; and other postoperative/anesthesia complications. Written informed consent was obtained.    Procedure Details:   The patient was then taken to the operating room where anesthesia was administered and was found to be adequate.  After a formal timeout was performed, she was placed in the dorsal lithotomy position and examined with the above findings. She was then prepped and draped in the sterile manner.  A speculum was then placed in the patient's vagina and a single tooth tenaculum was applied to the anterior lip of the cervix.   The uterus was sounded to 10cm. Her cervix was serially dilated to accommodate the myoscope, with  findings as above. A sharp curettage was then performed until there was a gritty texture in all four quadrants. The specimen was handed off to nursing.  The camera was reinserted and confirmed the uterus had been evacuated. The Mirena IUD was then inserted and deployed per standard fashion, with strings cut 2cm from cervical os.  The tenaculum was removed from the anterior lip of the cervix and the vaginal speculum was removed after noting good hemostasis. The patient tolerated the procedure well and was taken to the recovery area awake, extubated and in stable condition.  The patient will be discharged to home as per PACU criteria.  Routine postoperative instructions given. She will follow up in the clinic in two to four weeks for postoperative evaluation.  Ranae Plumber, MD [facility] Attending Gynecologist

## 2020-02-13 LAB — SURGICAL PATHOLOGY

## 2020-06-08 IMAGING — US US BREAST*R* LIMITED INC AXILLA
1 series · 5 of 5 positions shown · non-contrast
Comparison: Previous exam(s).

CLINICAL DATA: Slightly upper breast possible mass seen on most
recent screening mammography.

EXAM:
DIGITAL DIAGNOSTIC RIGHT MAMMOGRAM WITH CAD AND TOMO
ULTRASOUND RIGHT BREAST

[Series 1: us breast*right* limited inc axilla · 0.06mm/px · 5 of 5 slices shown]
[im 1/5]
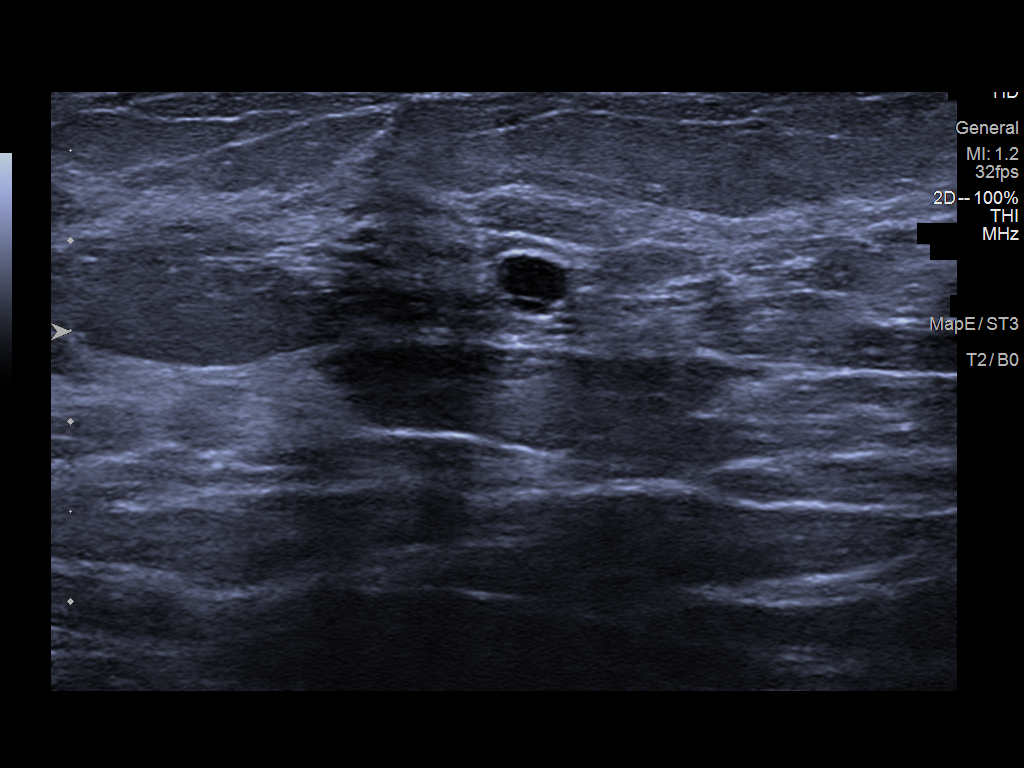
[im 2/5]
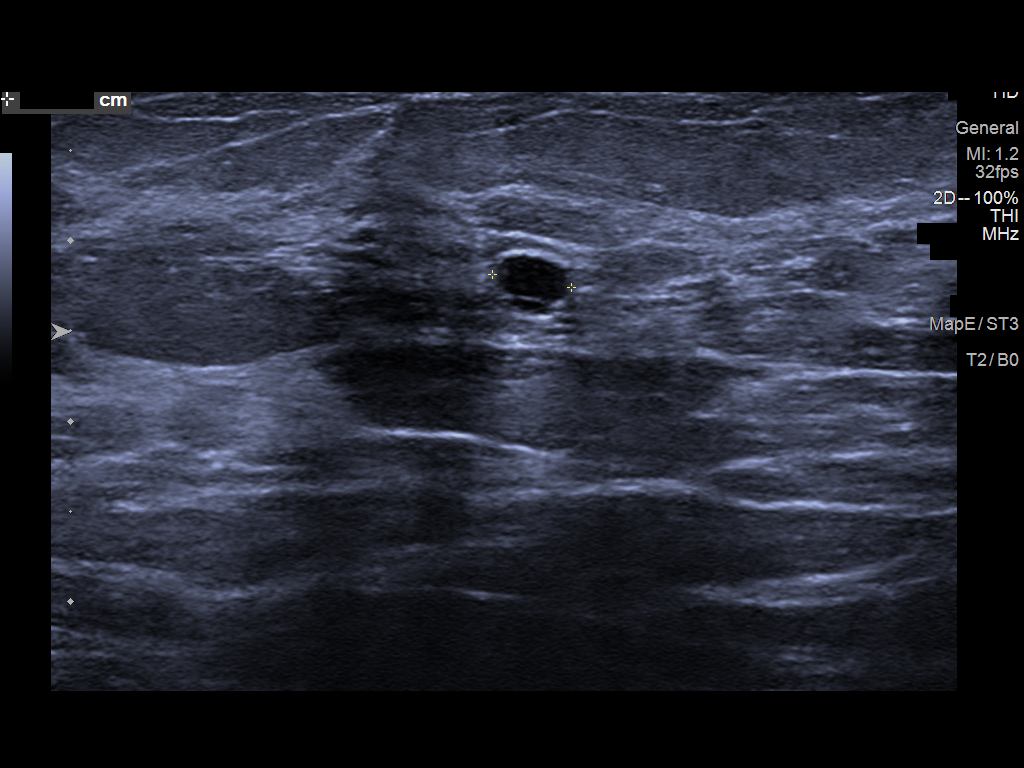
[im 3/5]
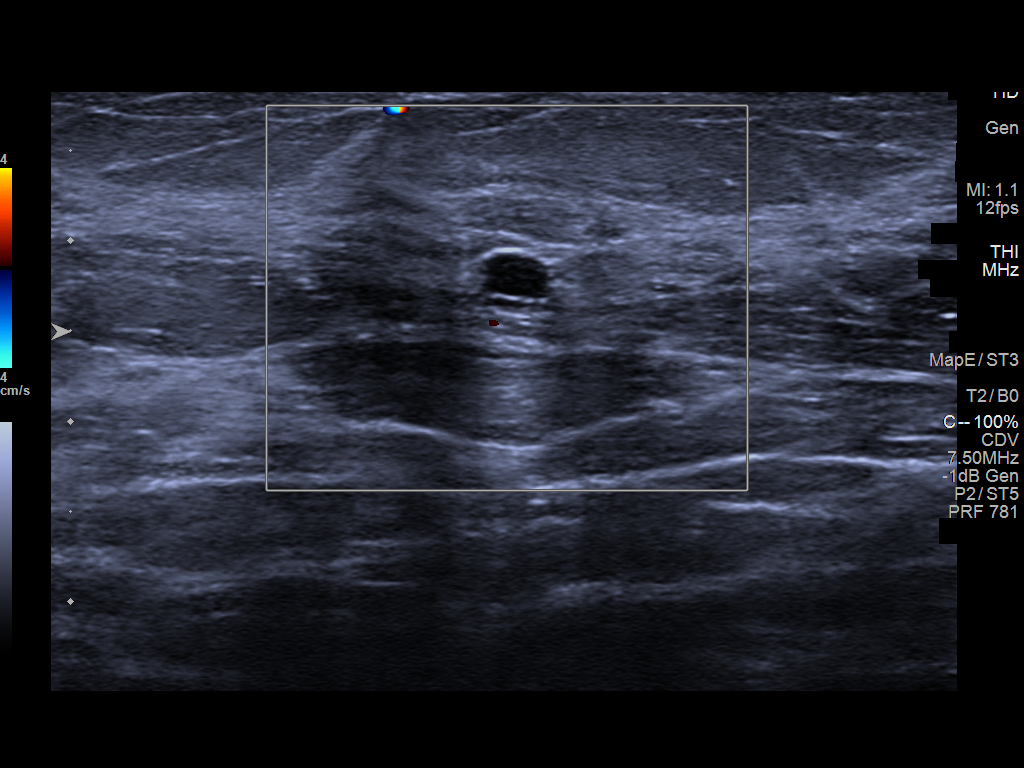
[im 4/5]
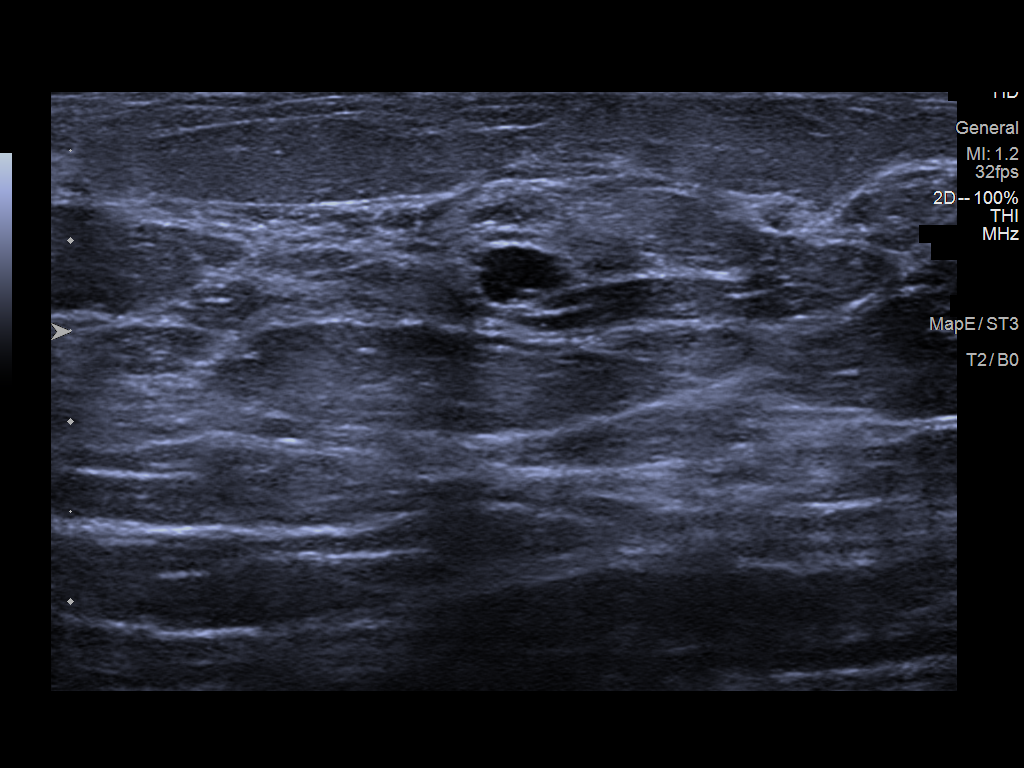
[im 5/5]
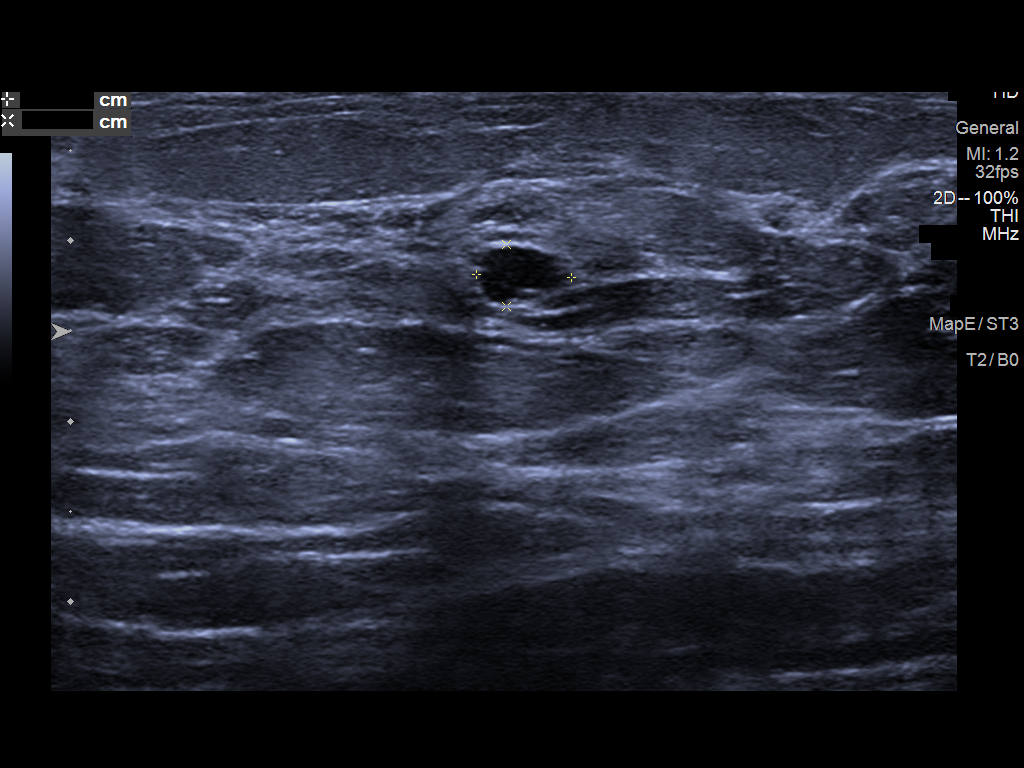

[5 of 5 positions shown; findings below may reference images not displayed]

ACR Breast Density Category b: There are scattered areas of
fibroglandular density.
FINDINGS: Additional mammographic views of the right breast demonstrate no
suspicious masses or areas of architectural distortion. The
previously questioned mass in the slightly upper right breast is not
clearly seen mammographically.

Mammographic images were processed with CAD.

Precautionary targeted right breast ultrasound is performed, showing
no suspicious masses. In the right breast 12 o'clock 5 cm from the
nipple there is a benign-appearing septated cyst measuring 0.4 by
0.5 by 0.3 cm. This finding may or may not correspond to the mass
questioned on the most recent screening mammogram.
IMPRESSION: Right breast 12 o'clock probably benign cyst, for which short-term
follow-up is recommended.

The mass questioned on the most recent screening mammogram dated
December 02, 2018 is not clearly identified on the additional views. A
diagnostic mammogram in 1 year is recommended.

RECOMMENDATION:
Targeted right breast ultrasound in 6 months.

I have discussed the findings and recommendations with the patient.
If applicable, a reminder letter will be sent to the patient
regarding the next appointment.

BI-RADS CATEGORY  3: Probably benign.

## 2020-06-08 IMAGING — MG MM DIGITAL DIAGNOSTIC UNILAT*R* W/ TOMO W/ CAD
4 series · 4 of 12 positions shown · non-contrast
Comparison: Previous exam(s).

CLINICAL DATA: Slightly upper breast possible mass seen on most
recent screening mammography.

EXAM:
DIGITAL DIAGNOSTIC RIGHT MAMMOGRAM WITH CAD AND TOMO
ULTRASOUND RIGHT BREAST

[R ML synth-2D]
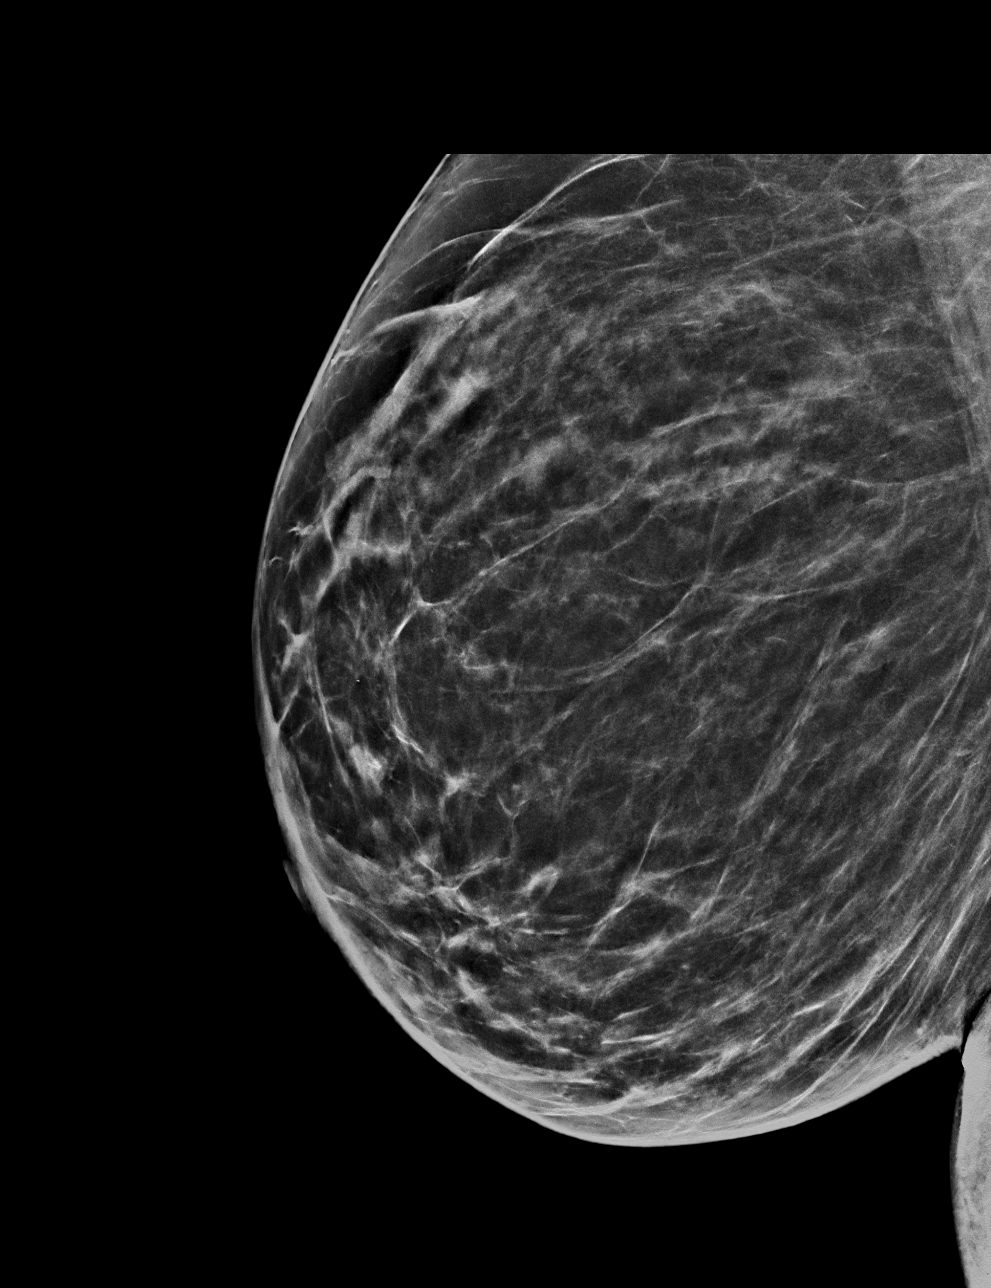

[R MLO synth-2D]
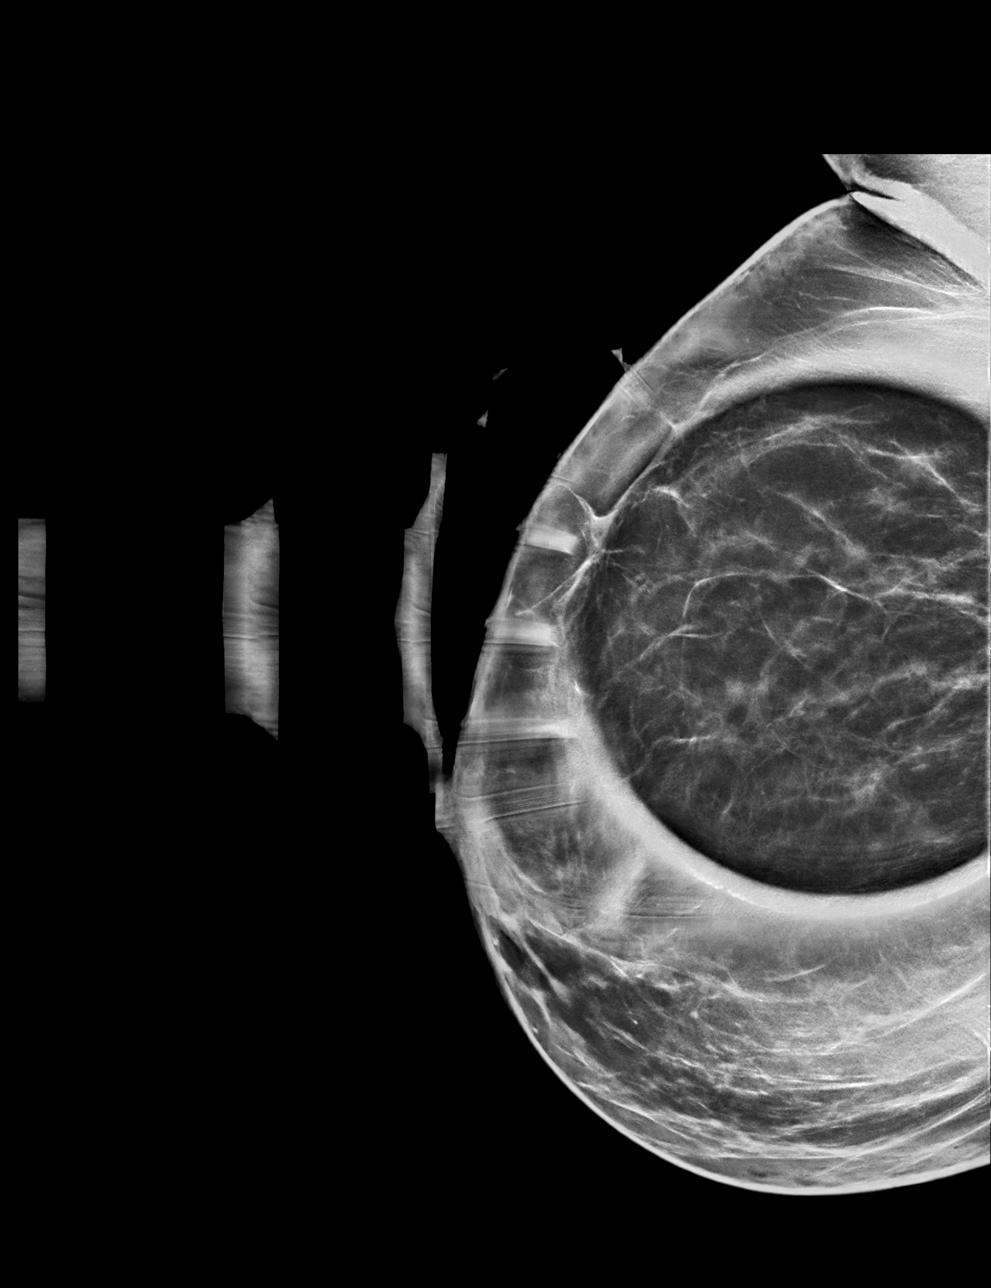

[R ML tomo · tomo slice 35/70.0]
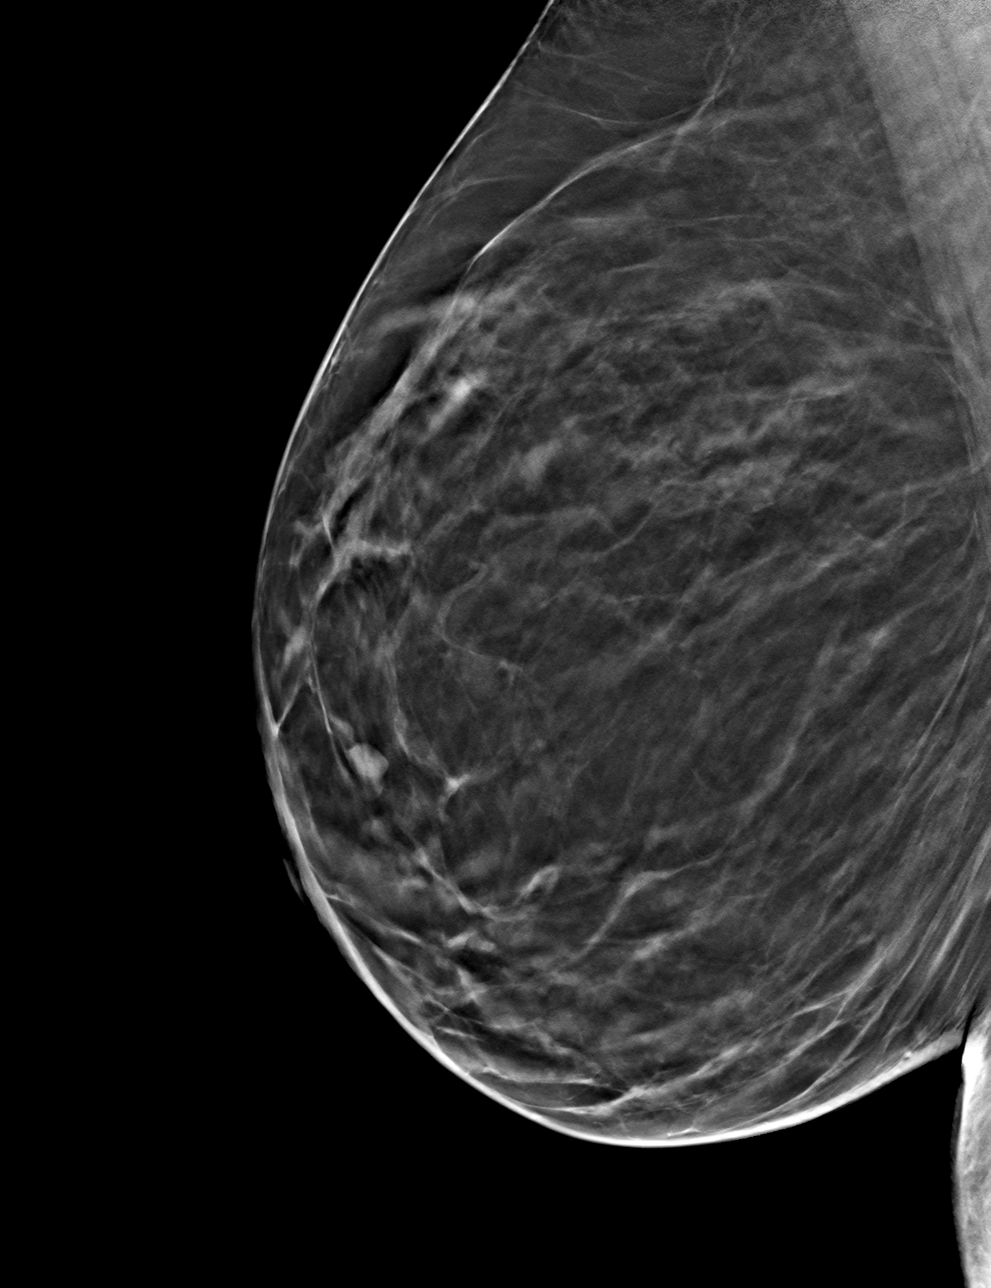

[R MLO tomo · tomo slice 35/68.0]
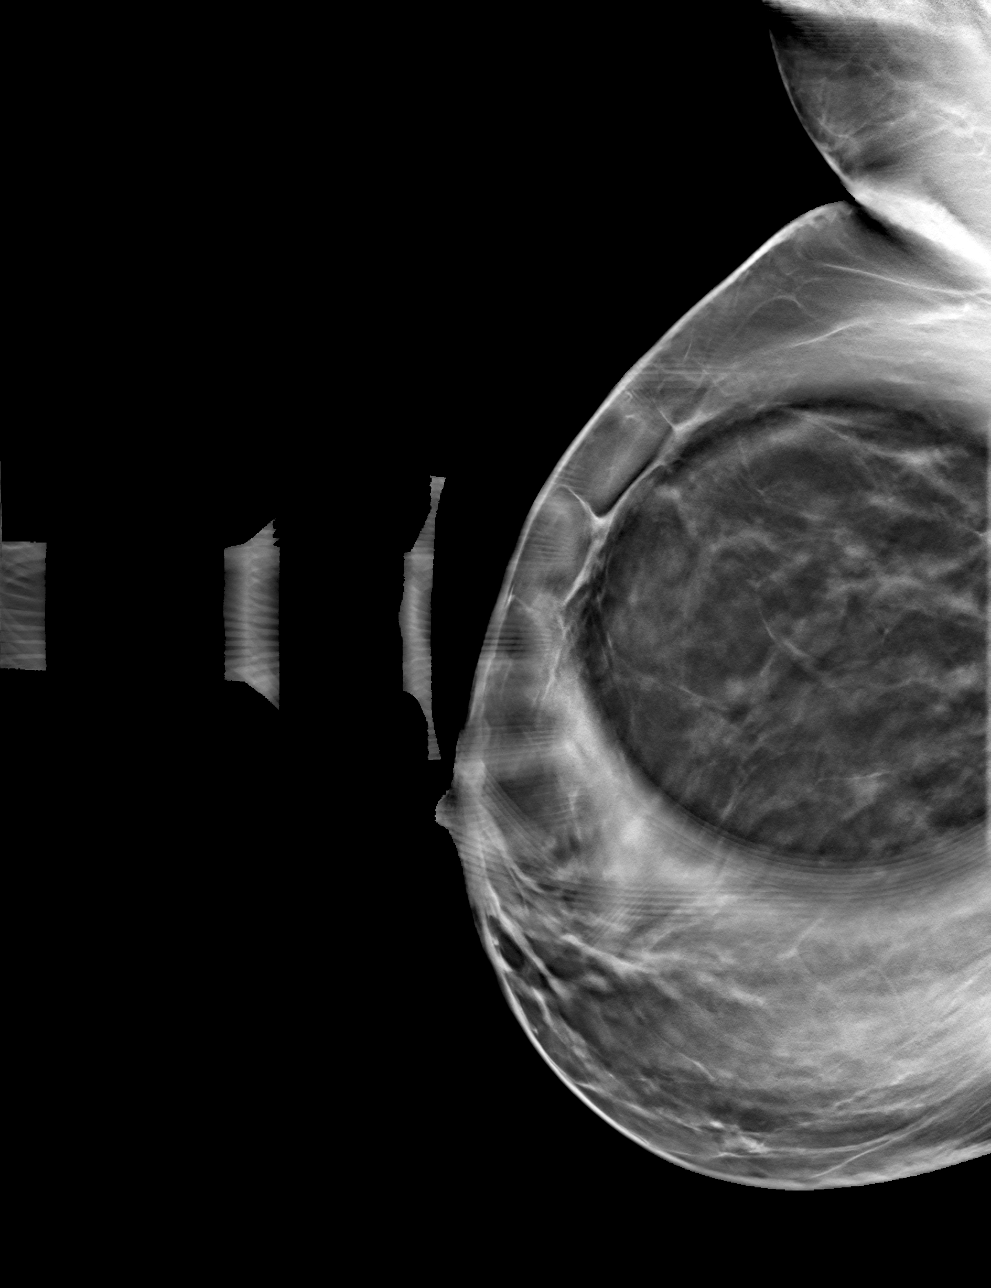

[4 of 12 positions shown; findings below may reference images not displayed]

ACR Breast Density Category b: There are scattered areas of
fibroglandular density.
FINDINGS: Additional mammographic views of the right breast demonstrate no
suspicious masses or areas of architectural distortion. The
previously questioned mass in the slightly upper right breast is not
clearly seen mammographically.

Mammographic images were processed with CAD.

Precautionary targeted right breast ultrasound is performed, showing
no suspicious masses. In the right breast 12 o'clock 5 cm from the
nipple there is a benign-appearing septated cyst measuring 0.4 by
0.5 by 0.3 cm. This finding may or may not correspond to the mass
questioned on the most recent screening mammogram.
IMPRESSION: Right breast 12 o'clock probably benign cyst, for which short-term
follow-up is recommended.

The mass questioned on the most recent screening mammogram dated
December 02, 2018 is not clearly identified on the additional views. A
diagnostic mammogram in 1 year is recommended.

RECOMMENDATION:
Targeted right breast ultrasound in 6 months.

I have discussed the findings and recommendations with the patient.
If applicable, a reminder letter will be sent to the patient
regarding the next appointment.

BI-RADS CATEGORY  3: Probably benign.

## 2021-03-04 ENCOUNTER — Other Ambulatory Visit: Payer: Self-pay | Admitting: Certified Nurse Midwife

## 2021-03-04 DIAGNOSIS — Z1231 Encounter for screening mammogram for malignant neoplasm of breast: Secondary | ICD-10-CM

## 2021-03-10 ENCOUNTER — Other Ambulatory Visit: Payer: Self-pay

## 2021-03-10 ENCOUNTER — Ambulatory Visit
Admission: RE | Admit: 2021-03-10 | Discharge: 2021-03-10 | Disposition: A | Discharge status: Home / Self Care | Payer: Managed Care, Other (non HMO) | Source: Ambulatory Visit | Attending: Certified Nurse Midwife | Admitting: Certified Nurse Midwife

## 2021-03-10 DIAGNOSIS — Z1231 Encounter for screening mammogram for malignant neoplasm of breast: Secondary | ICD-10-CM | POA: Diagnosis present

## 2022-06-01 DIAGNOSIS — I10 Essential (primary) hypertension: Secondary | ICD-10-CM | POA: Insufficient documentation

## 2022-06-02 ENCOUNTER — Other Ambulatory Visit: Payer: Self-pay | Admitting: Certified Nurse Midwife

## 2022-06-02 DIAGNOSIS — Z1231 Encounter for screening mammogram for malignant neoplasm of breast: Secondary | ICD-10-CM

## 2022-06-08 ENCOUNTER — Ambulatory Visit: Payer: Managed Care, Other (non HMO) | Admitting: Family

## 2022-06-08 ENCOUNTER — Encounter: Payer: Self-pay | Admitting: Family

## 2022-06-08 VITALS — BP 140/88 | HR 80 | Temp 98.0°F | Ht 67.0 in

## 2022-06-08 DIAGNOSIS — E6609 Other obesity due to excess calories: Secondary | ICD-10-CM | POA: Insufficient documentation

## 2022-06-08 DIAGNOSIS — Z975 Presence of (intrauterine) contraceptive device: Secondary | ICD-10-CM

## 2022-06-08 DIAGNOSIS — R5382 Chronic fatigue, unspecified: Secondary | ICD-10-CM | POA: Insufficient documentation

## 2022-06-08 DIAGNOSIS — R21 Rash and other nonspecific skin eruption: Secondary | ICD-10-CM

## 2022-06-08 DIAGNOSIS — E559 Vitamin D deficiency, unspecified: Secondary | ICD-10-CM | POA: Insufficient documentation

## 2022-06-08 DIAGNOSIS — L920 Granuloma annulare: Secondary | ICD-10-CM | POA: Insufficient documentation

## 2022-06-08 DIAGNOSIS — E782 Mixed hyperlipidemia: Secondary | ICD-10-CM

## 2022-06-08 DIAGNOSIS — R030 Elevated blood-pressure reading, without diagnosis of hypertension: Secondary | ICD-10-CM | POA: Diagnosis not present

## 2022-06-08 DIAGNOSIS — R0683 Snoring: Secondary | ICD-10-CM | POA: Diagnosis not present

## 2022-06-08 DIAGNOSIS — N938 Other specified abnormal uterine and vaginal bleeding: Secondary | ICD-10-CM | POA: Insufficient documentation

## 2022-06-08 DIAGNOSIS — R635 Abnormal weight gain: Secondary | ICD-10-CM | POA: Insufficient documentation

## 2022-06-08 NOTE — Progress Notes (Unsigned)
New Patient Office Visit  Subjective:  Patient ID: Dawn Singh, female    DOB: 07/23/1970  Age: 52 y.o. MRN: [id number]  CC:  Chief Complaint  Patient presents with   Medical Management of Chronic Issues    HPI Dawn Singh is here to establish care as a new patient.  Oriented to practice routines and expectations.  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   Pt is with acute concerns.   Pt is traveling internationally, and she will start checking her blood pressure next week.   chronic concerns:  Elevated BP without dx htn: has not been checking at home. Did see her GYN 2/26 and was elevated at 170/98. She states she is concerned because she has gained a good amount of weight over the last one year or so. Today blood pressure is 130/90. She does walk daily but diet isn't the best. No pedal edema. Is watching her coffee/ caffeine intake as well to see if this helps her with her blood pressure.   Repeat 140/88  Sleep disorder: takes melatonin mainly when she travels.    ROS: Negative unless specifically indicated above in HPI.   Current Outpatient Medications:    b complex vitamins capsule, Take 2 capsules by mouth daily., Disp: , Rfl:    Cholecalciferol (VITAMIN D3) 250 MCG (10000 UT) capsule, Take 10,000 Units by mouth daily., Disp: , Rfl:    ibuprofen (ADVIL) 200 MG tablet, Take 400 mg by mouth every 6 (six) hours as needed for headache or moderate pain., Disp: , Rfl:    Melatonin 1 MG CAPS, Take 1 mg by mouth at bedtime., Disp: , Rfl:    Menthol, Topical Analgesic, (TWO OLD GOATS ARTHRITIS EX), Apply 1 application topically daily as needed (pain)., Disp: , Rfl:  Past Medical History:  Diagnosis Date   Breast mass in female [id number]   Complication of anesthesia    very restless after procedure   Endometrial polyp 2015   H/O abnormal mammogram    History of Papanicolaou smear of cervix    Pneumonia 2009 h1n1   Past Surgical History:  Procedure  Laterality Date   COLONOSCOPY     HYSTEROSCOPY WITH D & C     HYSTEROSCOPY WITH D & C N/A 02/12/2020   Procedure: DILATATION AND CURETTAGE /HYSTEROSCOPY;  Surgeon: Ward, Honor Loh, MD;  Location: [facility] ORS;  Service: Gynecology;  Laterality: N/A;   INTRAUTERINE DEVICE (IUD) INSERTION N/A 02/12/2020   Procedure: INTRAUTERINE DEVICE (IUD) INSERTION;  Surgeon: Ward, Honor Loh, MD;  Location: [facility] ORS;  Service: Gynecology;  Laterality: N/A;    Objective:   Today's Vitals: BP (!) 140/88   Pulse 80   Temp 98 F (36.7 C) (Temporal)   Ht '5\' 7"'$  (1.702 m)   SpO2 98%   BMI 32.11 kg/m   Physical Exam Constitutional:      General: She is not in acute distress.    Appearance: Normal appearance. She is normal weight. She is not ill-appearing, toxic-appearing or diaphoretic.  HENT:     Head: Normocephalic.  Cardiovascular:     Rate and Rhythm: Normal rate and regular rhythm.  Pulmonary:     Effort: Pulmonary effort is normal.  Musculoskeletal:        General: Normal range of motion.     Right lower leg: No edema.     Left lower leg: No edema.  Neurological:     General: No focal deficit present.     Mental  Status: She is alert and oriented to person, place, and time. Mental status is at baseline.  Psychiatric:        Mood and Affect: Mood normal.        Behavior: Behavior normal.        Thought Content: Thought content normal.        Judgment: Judgment normal.     Assessment & Plan:  Chronic fatigue -     Vitamin B12; Future  Snoring  Obesity due to excess calories without serious comorbidity, unspecified classification -     Basic metabolic panel; Future -     T4, free; Future -     TSH; Future  Elevated blood pressure reading in office without diagnosis of hypertension -     TSH; Future  Mixed hyperlipidemia -     Lipid panel; Future  Abnormal weight gain -     T4, free; Future -     TSH; Future  Vitamin D deficiency -     VITAMIN D 25 Hydroxy (Vit-D Deficiency,  Fractures); Future  Skin rash  GA (granuloma annulare)  IUD (intrauterine device) in place    Follow-up: Return for as scheduled for TOC.   Eugenia Pancoast, FNP

## 2022-06-08 NOTE — Patient Instructions (Signed)
 ------------------------------------    Start monitoring your blood pressure daily, around the same time of day, for the next 2-3 weeks.  Ensure that you have rested for 30 minutes prior to checking your blood pressure. Record your readings and bring them to your next visit. Also please let me know in the next two weeks your average blood pressure goal <130/90.  ------------------------------------   I have created an order for lab work today during our visit.  Please schedule an appointment on your way out to return to the lab at your convenience. Please return fasting at your lab appointment (meaning you can only drink black coffee and or water prior to your appointment). I will reach out to you in regards to the labs when I receive the results.    Regards,   Eugenia Pancoast FNP-C

## 2022-06-11 NOTE — Assessment & Plan Note (Signed)
Pt advised to work on diet and exercise as tolerated

## 2022-06-11 NOTE — Assessment & Plan Note (Signed)
Ordered vitamin d pending results.

## 2022-06-11 NOTE — Assessment & Plan Note (Signed)
Ordering TSH  Pt advised of the following:  Continue medication as prescribed. Monitor blood pressure periodically and/or when you feel symptomatic. Goal is <130/90 on average. Ensure that you have rested for 30 minutes prior to checking your blood pressure. Record your readings and bring them to your next visit if necessary.work on a low sodium diet.

## 2022-06-11 NOTE — Assessment & Plan Note (Signed)
Ordered lipid panel, pending results. Work on low cholesterol diet and exercise as tolerated ? ?

## 2022-06-11 NOTE — Assessment & Plan Note (Signed)
Thyroid work up pending results

## 2022-06-29 ENCOUNTER — Encounter: Payer: Self-pay | Admitting: Radiology

## 2022-06-29 ENCOUNTER — Ambulatory Visit
Admission: RE | Admit: 2022-06-29 | Discharge: 2022-06-29 | Disposition: A | Discharge status: Home / Self Care | Payer: Managed Care, Other (non HMO) | Source: Ambulatory Visit | Attending: Certified Nurse Midwife | Admitting: Certified Nurse Midwife

## 2022-06-29 DIAGNOSIS — Z1231 Encounter for screening mammogram for malignant neoplasm of breast: Secondary | ICD-10-CM | POA: Diagnosis not present

## 2022-07-01 ENCOUNTER — Telehealth: Payer: Self-pay | Admitting: Family

## 2022-07-01 NOTE — Telephone Encounter (Signed)
Patient called in and stated that she needs her lab orders to be printed out as a hard copy. She stated that she is having them done at [facility] and they need a hard copy. She would like a call when this is ready for her to pick up. Thank you!

## 2022-07-02 NOTE — Telephone Encounter (Signed)
Spoke with the patient and advised the orders are printed and ready for pick up.

## 2022-07-08 DIAGNOSIS — N950 Postmenopausal bleeding: Secondary | ICD-10-CM | POA: Insufficient documentation

## 2022-07-16 ENCOUNTER — Ambulatory Visit: Payer: Managed Care, Other (non HMO) | Admitting: Family

## 2022-07-17 ENCOUNTER — Other Ambulatory Visit: Payer: Self-pay | Admitting: Family

## 2022-07-17 DIAGNOSIS — E782 Mixed hyperlipidemia: Secondary | ICD-10-CM

## 2022-07-18 LAB — BASIC METABOLIC PANEL
BUN/Creatinine Ratio: 20 (ref 9–23)
BUN: 15 mg/dL (ref 6–24)
CO2: 22 mmol/L (ref 20–29)
Calcium: 9.2 mg/dL (ref 8.7–10.2)
Chloride: 103 mmol/L (ref 96–106)
Creatinine, Ser: 0.75 mg/dL (ref 0.57–1.00)
Glucose: 95 mg/dL (ref 70–99)
Potassium: 4.2 mmol/L (ref 3.5–5.2)
Sodium: 140 mmol/L (ref 134–144)
eGFR: 96 mL/min/{1.73_m2} (ref >59)

## 2022-07-18 LAB — VITAMIN D 25 HYDROXY (VIT D DEFICIENCY, FRACTURES): Vit D, 25-Hydroxy: 41.8 ng/mL (ref 30.0–100.0)

## 2022-07-18 LAB — LIPID PANEL
Chol/HDL Ratio: 3.1 ratio (ref 0.0–4.4)
Cholesterol, Total: 226 mg/dL — ABNORMAL HIGH (ref 100–199)
HDL: 73 mg/dL (ref >39)
LDL Chol Calc (NIH): 142 mg/dL — ABNORMAL HIGH (ref 0–99)
Triglycerides: 65 mg/dL (ref 0–149)
VLDL Cholesterol Cal: 11 mg/dL (ref 5–40)

## 2022-07-18 LAB — TSH: TSH: 1.94 u[IU]/mL (ref 0.450–4.500)

## 2022-07-18 LAB — VITAMIN B12: Vitamin B-12: 563 pg/mL (ref 232–1245)

## 2022-07-18 LAB — T4, FREE: Free T4: 1.08 ng/dL (ref 0.82–1.77)

## 2022-07-20 NOTE — Progress Notes (Signed)
Was she fasting at her lab visit?  Cholesterol much too elevated. With recent elevation of blood pressure I would suggest we get a bit more strict on cholesterol management. I do suggest a daily medication such as lipitor to help with cholesterol control. Would pt be ok with starting this?

## 2022-07-22 MED ORDER — ATORVASTATIN CALCIUM 10 MG PO TABS: 10.0000 mg | ORAL_TABLET | Freq: Every day | ORAL | 3 refills | Status: DC

## 2022-07-22 NOTE — Addendum Note (Signed)
Addended by: [name] on: 07/22/2022 08:11 AM   Modules accepted: Orders

## 2022-07-24 ENCOUNTER — Telehealth: Payer: Self-pay | Admitting: Family

## 2022-07-24 DIAGNOSIS — E782 Mixed hyperlipidemia: Secondary | ICD-10-CM

## 2022-07-24 MED ORDER — ATORVASTATIN CALCIUM 10 MG PO TABS: 10.0000 mg | ORAL_TABLET | Freq: Every day | ORAL | 3 refills | Status: DC

## 2022-07-24 NOTE — Telephone Encounter (Signed)
Patient needs medication atorvastatin (LIPITOR) 10 MG tablet  sent to   [id number] - [place], [place] - [address] AT [facility] OF [address] Phone: [phone]  Fax: [phone]      [facility] was [facility],which doesn't honor her insurance for her medications.

## 2022-07-24 NOTE — Telephone Encounter (Signed)
Pharmacy updated and sent as requested.

## 2022-08-17 ENCOUNTER — Ambulatory Visit: Payer: Managed Care, Other (non HMO) | Admitting: Family

## 2022-08-17 ENCOUNTER — Encounter: Payer: Self-pay | Admitting: Family

## 2022-08-17 VITALS — BP 179/100 | HR 74 | Temp 97.7°F | Ht 67.0 in | Wt 260.0 lb

## 2022-08-17 DIAGNOSIS — Z975 Presence of (intrauterine) contraceptive device: Secondary | ICD-10-CM | POA: Diagnosis not present

## 2022-08-17 DIAGNOSIS — E6609 Other obesity due to excess calories: Secondary | ICD-10-CM

## 2022-08-17 DIAGNOSIS — N950 Postmenopausal bleeding: Secondary | ICD-10-CM

## 2022-08-17 DIAGNOSIS — E782 Mixed hyperlipidemia: Secondary | ICD-10-CM | POA: Diagnosis not present

## 2022-08-17 DIAGNOSIS — I10 Essential (primary) hypertension: Secondary | ICD-10-CM

## 2022-08-17 MED ORDER — WEGOVY 0.5 MG/0.5ML ~~LOC~~ SOAJ
SUBCUTANEOUS | 2 refills | Status: DC
Start: 2022-08-17 — End: 2022-10-14

## 2022-08-17 MED ORDER — WEGOVY 0.25 MG/0.5ML ~~LOC~~ SOAJ
SUBCUTANEOUS | 0 refills | Status: DC
Start: 2022-08-17 — End: 2022-11-16

## 2022-08-17 MED ORDER — LOSARTAN POTASSIUM 50 MG PO TABS
50.0000 mg | ORAL_TABLET | Freq: Every day | ORAL | 3 refills | Status: DC
Start: 2022-08-17 — End: 2023-08-09

## 2022-08-17 MED ORDER — PRAVASTATIN SODIUM 10 MG PO TABS
10.0000 mg | ORAL_TABLET | Freq: Every day | ORAL | 3 refills | Status: DC
Start: 2022-08-17 — End: 2023-08-09

## 2022-08-17 NOTE — Assessment & Plan Note (Signed)
Pt advised to work on diet and exercise as tolerated  

## 2022-08-17 NOTE — Progress Notes (Signed)
New Patient Office Visit  Subjective:  Patient ID: Dawn Singh, female    DOB: 1970-06-14  Age: 52 y.o. MRN: [id number]  CC:  Chief Complaint  Patient presents with   Establish Care    HPI Dawn Singh is here to establish care as a new patient, she has been here in the last few weeks due to high blood pressure. Oriented to practice routines and expectations.  Prior provider was: has not had one in some time, does see GYN [facility] however she has since left the practice so she is awaiting a new GYN  Pt is without acute concerns.   chronic concerns:  Obesity: has been hard for her to lose weight. Tsh normal. Had gained 70 pounds over the last one year. She is no longer able to run, states has not had success with exercise and losing weight. She is trying to recently count her calories, she does admit to stress eating.   HTN: average 135/140 systolic and around 85 average diastolic. No cp palp or sob.  HLD: was given atorvastatin however she does state she often takes grapefruit so she has not yet started.   Lab Results  Component Value Date   CHOL 226 (H) 07/17/2022   HDL 73 07/17/2022   LDLCALC 142 (H) 07/17/2022   TRIG 65 07/17/2022   CHOLHDL 3.1 07/17/2022   Wt Readings from Last 3 Encounters:  02/12/20 205 lb (93 kg)     ROS: Negative unless specifically indicated above in HPI.   Current Outpatient Medications:    b complex vitamins capsule, Take 2 capsules by mouth daily., Disp: , Rfl:    Cholecalciferol (VITAMIN D3) 250 MCG (10000 UT) capsule, Take 10,000 Units by mouth daily., Disp: , Rfl:    ibuprofen (ADVIL) 200 MG tablet, Take 400 mg by mouth every 6 (six) hours as needed for headache or moderate pain., Disp: , Rfl:    losartan (COZAAR) 50 MG tablet, Take 1 tablet (50 mg total) by mouth daily., Disp: 90 tablet, Rfl: 3   Melatonin 1 MG CAPS, Take 1 mg by mouth at bedtime. PRN, Disp: , Rfl:    Menthol, Topical Analgesic, (TWO OLD GOATS ARTHRITIS  EX), Apply 1 application topically daily as needed (pain)., Disp: , Rfl:    pravastatin (PRAVACHOL) 10 MG tablet, Take 1 tablet (10 mg total) by mouth daily., Disp: 90 tablet, Rfl: 3   Semaglutide-Weight Management (WEGOVY) 0.25 MG/0.5ML SOAJ, Start 0.25 mg qweek for four weeks, then increase to 0.5 mg dose qweek, Disp: 2 mL, Rfl: 0   Semaglutide-Weight Management (WEGOVY) 0.5 MG/0.5ML SOAJ, After four weeks of 0.25 mg qweek, increase to 0.5 mg qweek, Disp: 2 mL, Rfl: 2 Past Medical History:  Diagnosis Date   Breast mass in female 2015   Complication of anesthesia    very restless after procedure   Endometrial polyp 2015   H/O abnormal mammogram    History of Papanicolaou smear of cervix    Pneumonia 2009 h1n1   Past Surgical History:  Procedure Laterality Date   COLONOSCOPY     HYSTEROSCOPY WITH D & C     HYSTEROSCOPY WITH D & C N/A 02/12/2020   Procedure: DILATATION AND CURETTAGE /HYSTEROSCOPY;  Surgeon: Ward, Elenora Fender, MD;  Location: [facility] ORS;  Service: Gynecology;  Laterality: N/A;   INTRAUTERINE DEVICE (IUD) INSERTION N/A 02/12/2020   Procedure: INTRAUTERINE DEVICE (IUD) INSERTION;  Surgeon: Ward, Elenora Fender, MD;  Location: [facility] ORS;  Service: Gynecology;  Laterality: N/A;  Objective:   Today's Vitals: BP (!) 179/100 (BP Location: Left Arm, Cuff Size: Normal) Comment: automatic BP machine  Pulse 74   Temp 97.7 F (36.5 C) (Temporal)   SpO2 99%   Physical Exam Constitutional:      General: She is not in acute distress.    Appearance: Normal appearance. She is obese. She is not ill-appearing, toxic-appearing or diaphoretic.  HENT:     Head: Normocephalic.  Cardiovascular:     Rate and Rhythm: Normal rate and regular rhythm.  Pulmonary:     Effort: Pulmonary effort is normal.  Musculoskeletal:        General: Normal range of motion.  Neurological:     General: No focal deficit present.     Mental Status: She is alert and oriented to person, place, and time. Mental  status is at baseline.  Psychiatric:        Mood and Affect: Mood normal.        Behavior: Behavior normal.        Thought Content: Thought content normal.        Judgment: Judgment normal.     Assessment & Plan:  Mixed hyperlipidemia Assessment & Plan: Ordered lipid panel, pending results. Work on low cholesterol diet and exercise as tolerated Switch atorvastatin to pravastatin as pt eats a lot of grapefruit.  Work on low cholesterol diet and exercise as tolerated   Orders: -     Pravastatin Sodium; Take 1 tablet (10 mg total) by mouth daily.  Dispense: 90 tablet; Refill: 3  Postmenopausal bleeding  IUD (intrauterine device) in place -     Ambulatory referral to Obstetrics / Gynecology  Obesity due to excess calories without serious comorbidity, unspecified classification Assessment & Plan: Pt advised to work on diet and exercise as tolerated   Orders: -     Amb Ref to Medical Weight Management  Primary hypertension Assessment & Plan: New dx confirmed by 3 reports of AHBPM Start losartan 50 mg once daily  Pt advised of the following:  Continue medication as prescribed. Monitor blood pressure periodically and/or when you feel symptomatic. Goal is <130/90 on average. Ensure that you have rested for 30 minutes prior to checking your blood pressure. Record your readings and bring them to your next visit if necessary.work on a low sodium diet.    Orders: -     Amb Ref to Medical Weight Management -     Losartan Potassium; Take 1 tablet (50 mg total) by mouth daily.  Dispense: 90 tablet; Refill: 3    Follow-up: Return in about 3 months (around 11/17/2022) for f/u blood pressure, f/u cholesterol.   [name], FNP

## 2022-08-17 NOTE — Assessment & Plan Note (Signed)
New dx confirmed by 3 reports of AHBPM Start losartan 50 mg once daily  Pt advised of the following:  Continue medication as prescribed. Monitor blood pressure periodically and/or when you feel symptomatic. Goal is <130/90 on average. Ensure that you have rested for 30 minutes prior to checking your blood pressure. Record your readings and bring them to your next visit if necessary.work on a low sodium diet.

## 2022-08-17 NOTE — Assessment & Plan Note (Signed)
Ordered lipid panel, pending results. Work on low cholesterol diet and exercise as tolerated Switch atorvastatin to pravastatin as pt eats a lot of grapefruit.  Work on low cholesterol diet and exercise as tolerated

## 2022-08-17 NOTE — Patient Instructions (Signed)
Start losartan 50 mg once daily for blood pressure.   Start pravastatin 10 mg once nightly for cholesterol.   A referral was placed today for weight loss clinic.  Please let us know if you have not heard back within 2 weeks about the referral.   Regards,   Bellatrix Devonshire FNP-C

## 2022-08-27 ENCOUNTER — Telehealth: Payer: Self-pay

## 2022-08-27 NOTE — Telephone Encounter (Signed)
Tried initiating PA through [employer]; KEY: [id number].  Unable to complete because we do not have a current height/weight to calculate BMI (this will need to be documented in the form of an office visit).

## 2022-08-27 NOTE — Telephone Encounter (Signed)
Weight was taken at the last visit, but somehow did not stick. I called and left a message advising the patient to call our office back with a weight so that we can addend the notes for the PA request. We may have to bring her in for a nurse visit for a weight check.

## 2022-08-27 NOTE — Telephone Encounter (Signed)
Patients weight and height has been updated in the recent note.

## 2022-08-27 NOTE — Telephone Encounter (Signed)
PA sent, awaiting determination.

## 2022-08-28 NOTE — Telephone Encounter (Signed)
PA approved.   Request Reference Number: [id number]. WEGOVY INJ 0.25MG  is approved through 03/29/2023. Your patient may now fill this prescription and it will be covered.

## 2022-09-07 ENCOUNTER — Encounter: Payer: Self-pay | Admitting: Family

## 2022-09-16 DIAGNOSIS — Z0289 Encounter for other administrative examinations: Secondary | ICD-10-CM

## 2022-09-17 ENCOUNTER — Ambulatory Visit (INDEPENDENT_AMBULATORY_CARE_PROVIDER_SITE_OTHER): Payer: Managed Care, Other (non HMO) | Admitting: Physician Assistant

## 2022-09-17 VITALS — Ht 66.25 in | Wt 263.0 lb

## 2022-09-17 DIAGNOSIS — E782 Mixed hyperlipidemia: Secondary | ICD-10-CM | POA: Diagnosis not present

## 2022-09-17 DIAGNOSIS — Z6841 Body Mass Index (BMI) 40.0 and over, adult: Secondary | ICD-10-CM

## 2022-09-17 DIAGNOSIS — I10 Essential (primary) hypertension: Secondary | ICD-10-CM

## 2022-09-17 DIAGNOSIS — R5382 Chronic fatigue, unspecified: Secondary | ICD-10-CM

## 2022-09-17 DIAGNOSIS — E559 Vitamin D deficiency, unspecified: Secondary | ICD-10-CM | POA: Diagnosis not present

## 2022-09-17 NOTE — Progress Notes (Signed)
Office: [phone]  /  Fax: [phone]   Initial Visit  Dawn Singh was seen in clinic today to evaluate for obesity. She is interested in losing weight to improve overall health and reduce the risk of weight related complications. She presents today to review program treatment options, initial physical assessment, and evaluation.     She was referred by: PCP  When asked what else they would like to accomplish? 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  She subsequently developed runners colitis/ischemic colitis and has severe sensitivity related to complex carbohydrates and other food items.  She gradually gained some weight due to this and then over the past 2 years gained a more significant amount of weight. The patient travels a tremendous amount for work and this is made it more difficult to focus on her nutrition and exercise.  This includes international travel for work on a regular basis.  When asked how has your weight affected you? She states: Has affected self-esteem, Contributed to medical problems, Having fatigue, Having poor endurance, and Has affected mood   Some associated conditions: Hypertension, Hyperlipidemia, OSA, and Other: Reports a more remote history of an overactive adrenal gland and saw endocrinology many years ago with no medications or other treatments.  Contributing factors: Family history, Nutritional, Stress, and Reduced physical activity  Weight promoting medications identified: None  Current nutrition plan: Low-carb, High-protein, and Other: Due to her runners colitis with history of previous GI bleeding, she has to be very careful with high-fiber/complex  carbohydrates.  Current level of physical activity: Walking and Other: Activity is limited by her runners colitis symptoms  Current or previous pharmacotherapy: GLP-1-she just started Wegovy  Response to medication:  She has just started [facility] and has not had sufficient time to see if it is going to aid weight loss.   Past medical history includes:   Past Medical History:  Diagnosis Date   Breast mass in female 2015   Complication of anesthesia    very restless after procedure   Endometrial polyp 2015   H/O abnormal mammogram    History of Papanicolaou smear of cervix    Pneumonia 2009 h1n1     Objective:   Ht 5' 6.25" (1.683 m)   Wt 263 lb (119.3 kg)   BMI 42.13 kg/m  She was weighed on the bioimpedance scale: Body mass index is 42.13 kg/m.  Peak Weight:272 , Body Fat%:47.1, Visceral Fat Rating:15, Weight trend over the last 12 months: Increasing  General:  Alert, oriented and cooperative. Patient is in no acute distress.  Respiratory: Normal respiratory effort, no problems with respiration noted   Gait: able to ambulate independently  Mental Status: Normal mood and affect. Normal behavior. Normal judgment and thought content.   DIAGNOSTIC DATA REVIEWED:  BMET    Component Value Date/Time   NA 140 07/17/2022 0711   K 4.2 07/17/2022 0711   CL 103 07/17/2022 0711   CO2 22 07/17/2022 0711   GLUCOSE 95 07/17/2022 0711   GLUCOSE 107 (H) 02/08/2020 0832   BUN 15 07/17/2022 0711   CREATININE 0.75 07/17/2022 0711   CALCIUM 9.2 07/17/2022 0711   GFRNONAA >60 02/08/2020 0832   No results found for: "HGBA1C" No results found for: "INSULIN" CBC  Component Value Date/Time   WBC 6.6 02/08/2020 0832   RBC 4.72 02/08/2020 0832   HGB 14.1 02/08/2020 0832   HCT 42.0 02/08/2020 0832   PLT 209 02/08/2020 0832   MCV 89.0 02/08/2020 0832   MCH 29.9 02/08/2020 0832   MCHC 33.6 02/08/2020 0832   RDW 13.1 02/08/2020 0832   Iron/TIBC/Ferritin/ %Sat No results found for:  "IRON", "TIBC", "FERRITIN", "IRONPCTSAT" Lipid Panel     Component Value Date/Time   CHOL 226 (H) 07/17/2022 0711   TRIG 65 07/17/2022 0711   HDL 73 07/17/2022 0711   CHOLHDL 3.1 07/17/2022 0711   LDLCALC 142 (H) 07/17/2022 0711   Hepatic Function Panel  No results found for: "PROT", "ALBUMIN", "AST", "ALT", "ALKPHOS", "BILITOT", "BILIDIR", "IBILI"    Component Value Date/Time   TSH 1.940 07/17/2022 0711     Assessment and Plan:   Vitamin D deficiency  Primary hypertension  Mixed hyperlipidemia  Class 3 severe obesity due to excess calories with serious comorbidity (BMI) of 40.0 to 44.9 in adult [facility])  Hypertension Hypertension control uncertain, needs further observation, and needs improvement.  Medication(s): Losartan 50 mg once daily  BP Readings from Last 3 Encounters:  08/17/22 (!) 179/100  06/08/22 (!) 140/88  02/12/20 136/72   Lab Results  Component Value Date   CREATININE 0.75 07/17/2022   CREATININE 0.75 02/08/2020   No results found for: "GFR"  Plan: Continue all antihypertensives at current dosages. Will evaluate the patient's basal metabolic rate and laboratory data to formulate the nutrition plan and exercise plan to promote weight loss and improve blood pressure control.  Hyperlipidemia LDL is not at goal. Medication(s): Pravastatin 10 mg p.o. daily Cardiovascular risk factors: dyslipidemia, hypertension, obesity (BMI >= 30 kg/m2), and sedentary lifestyle  Lab Results  Component Value Date   CHOL 226 (H) 07/17/2022   HDL 73 07/17/2022   LDLCALC 142 (H) 07/17/2022   TRIG 65 07/17/2022   CHOLHDL 3.1 07/17/2022   No results found for: "ALT", "AST", "GGT", "ALKPHOS", "BILITOT" The 10-year ASCVD risk score (Arnett DK, et al., 2019) is: 3.3%   Values used to calculate the score:     Age: 75 years     Sex: Female     Is Non-Hispanic African American: No     Diabetic: No     Tobacco smoker: No     Systolic Blood Pressure: 179 mmHg     Is BP  treated: Yes     HDL Cholesterol: 73 mg/dL     Total Cholesterol: 226 mg/dL  Plan: Continue statin. Will assess current laboratory data and metabolic conditions to formulate an appropriate nutrition plan and exercise plan to promote weight loss and improve in lipid profiles to reduce cardiovascular risk.  Vitamin D Deficiency Vitamin D is not at goal of 50.  Most recent vitamin D level was 41.8. She is on over-the-counter vitamin D 10,000 units daily of D3. Lab Results  Component Value Date   VD25OH 41.8 07/17/2022    Plan:  Low vitamin D levels can be associated with adiposity and may result in leptin resistance and weight gain. Also associated with fatigue. Currently on vitamin D supplementation without any adverse effects.  Will regularly assess vitamin D levels to optimize supplementation       Obesity Treatment / Action Plan:  Patient will work on garnering support from family and friends to begin weight loss journey. Will work on eliminating or reducing the presence of highly palatable, calorie dense foods in the  home. Will complete provided nutritional and psychosocial assessment questionnaire before the next appointment. Will be scheduled for indirect calorimetry to determine resting energy expenditure in a fasting state.  This will allow [place] to create a reduced calorie, high-protein meal plan to promote loss of fat mass while preserving muscle mass. Counseled on the health benefits of losing 5%-15% of total body weight. Was counseled on nutritional approaches to weight loss and benefits of reducing processed foods and consuming plant-based foods and high quality protein as part of nutritional weight management. Was counseled on pharmacotherapy and role as an adjunct in weight management.   Obesity Education Performed Today:  She was weighed on the bioimpedance scale and results were discussed and documented in the synopsis.  We discussed obesity as a disease and the  importance of a more detailed evaluation of all the factors contributing to the disease.  We discussed the importance of long term lifestyle changes which include nutrition, exercise and behavioral modifications as well as the importance of customizing this to her specific health and social needs.  We discussed the benefits of reaching a healthier weight to alleviate the symptoms of existing conditions and reduce the risks of the biomechanical, metabolic and psychological effects of obesity.  Shulanda Schloemer appears to be in the action stage of change and states they are ready to start intensive lifestyle modifications and behavioral modifications.  30 minutes was spent today on this visit including the above counseling, pre-visit chart review, and post-visit documentation.  Reviewed by clinician on day of visit: allergies, medications, problem list, medical history, surgical history, family history, social history, and previous encounter notes pertinent to obesity diagnosis.  Ismahan Lippman,PA-C

## 2022-09-21 ENCOUNTER — Encounter (INDEPENDENT_AMBULATORY_CARE_PROVIDER_SITE_OTHER): Payer: Self-pay | Admitting: Physician Assistant

## 2022-09-21 DIAGNOSIS — E66813 Obesity, class 3: Secondary | ICD-10-CM | POA: Insufficient documentation

## 2022-10-14 ENCOUNTER — Encounter (INDEPENDENT_AMBULATORY_CARE_PROVIDER_SITE_OTHER): Payer: Self-pay | Admitting: Family Medicine

## 2022-10-14 ENCOUNTER — Ambulatory Visit (INDEPENDENT_AMBULATORY_CARE_PROVIDER_SITE_OTHER): Payer: Managed Care, Other (non HMO) | Admitting: Family Medicine

## 2022-10-14 VITALS — BP 115/78 | HR 84 | Temp 98.1°F | Ht 66.0 in | Wt 256.0 lb

## 2022-10-14 DIAGNOSIS — E559 Vitamin D deficiency, unspecified: Secondary | ICD-10-CM | POA: Diagnosis not present

## 2022-10-14 DIAGNOSIS — K580 Irritable bowel syndrome with diarrhea: Secondary | ICD-10-CM

## 2022-10-14 DIAGNOSIS — E538 Deficiency of other specified B group vitamins: Secondary | ICD-10-CM | POA: Diagnosis not present

## 2022-10-14 DIAGNOSIS — R5383 Other fatigue: Secondary | ICD-10-CM | POA: Diagnosis not present

## 2022-10-14 DIAGNOSIS — R0602 Shortness of breath: Secondary | ICD-10-CM

## 2022-10-14 DIAGNOSIS — Z6841 Body Mass Index (BMI) 40.0 and over, adult: Secondary | ICD-10-CM

## 2022-10-14 DIAGNOSIS — E782 Mixed hyperlipidemia: Secondary | ICD-10-CM | POA: Diagnosis not present

## 2022-10-14 DIAGNOSIS — I10 Essential (primary) hypertension: Secondary | ICD-10-CM

## 2022-10-14 DIAGNOSIS — Z1331 Encounter for screening for depression: Secondary | ICD-10-CM

## 2022-10-14 NOTE — Progress Notes (Signed)
Dawn Singh, D.O.  ABFM, ABOM Specializing in Clinical Bariatric Medicine Office located at: [address]  [place], [place]  [address]     Bariatric Medicine Visit  Dear Dawn Sawyers, FNP,   Thank you for referring Dawn Singh to our clinic today for evaluation.  We performed a consultation to discuss her options for treatment and educate the patient on her disease state.  The following note includes my evaluation and treatment recommendations.   Please do not hesitate to reach out to me directly if you have any further concerns.     Assessment and Plan:   Orders Placed This Encounter  Procedures   CBC with Differential/Platelet   Comprehensive metabolic panel   Folate   Hemoglobin A1c   Insulin, random   Lipid Panel With LDL/HDL Ratio   T4, free   TSH   Vitamin B12   VITAMIN D 25 Hydroxy (Vit-D Deficiency, Fractures)   EKG 12-Lead    Medications Discontinued During This Encounter  Medication Reason   Menthol, Topical Analgesic, (TWO OLD GOATS ARTHRITIS EX)    Semaglutide-Weight Management (WEGOVY) 0.5 MG/0.5ML SOAJ      1) Fatigue Assessment: Condition is Not optimized. Dawn Singh does feel that her weight is causing her energy to be lower than it should be. Fatigue may be related to obesity, depression or many other causes. she does not appear to have any red flag symptoms and this appears to most likely be related to her current lifestyle habits and dietary intake.  Plan:  Labs will be ordered and reviewed with her at their next office visit in two weeks. Epworth sleepiness scale score appears to be within normal limits.  Her ESS score is 3.   Dawn Singh admits to daytime somnolence occasionally and denies waking up still tired. Dawn Singh generally gets 8 or 9 hours of sleep per night, and states that she has generally restful sleep. Snoring is present. Apneic episodes is not  present.  ECG: Normal sinus rhythm, rate 80 bpm; reassuring without any  acute abnormalities, will continue to monitor for symptoms  Depression screen [Z13.31]/Modified PHQ-9 Depression Screen: Her Food and Mood (modified PHQ-9) score was 7. In the meanwhile, Dawn Singh will focus on self care including making healthy food choices by following their meal plan, improving sleep quality and focusing on stress reduction.  Once we are assured she is on an appropriate meal plan, we will start discussing exercise to increase cardiovascular fitness levels.    2) Shortness of breath on exertion Assessment: Condition is Not optimized. Dawn Singh does feel that she gets out of breath more easily than she used to when she exercises and seems to be worsening over time with weight gain.  This has gotten worse recently. Dawn Singh denies shortness of breath at rest or orthopnea. Dawn Singh's shortness of breath appears to be obesity related and exercise induced, as they do not appear to have any "red flag" symptoms/ concerns today.  Also, this condition appears to be related to a state of poor cardiovascular conditioning   Plan:  Obtain labs today and will be reviewed with her at their next office visit in two weeks. Indirect Calorimeter completed today to help guide our dietary regimen. It shows a VO2 of 266 and a REE of 1829.  Her calculated basal metabolic rate is 1914 thus her measured basal metabolic rate is worse than expected. Patient agreed to work on weight loss at this time.  As Dawn Singh progresses through our weight loss program, we  will gradually increase exercise as tolerated to treat her current condition. If Dawn Singh follows our recommendations and loses 5-10% of their weight without improvement of her shortness of breath or if at any time, symptoms become more concerning, they agree to urgently follow up with their PCP/ specialist for further consideration/ evaluation.   Dawn Singh verbalizes agreement with this plan.    Mixed hyperlipidemia Assessment: Condition is Not optimized.  Pt endorses that her cholesterol levels have been steadily rising in the last 1-2 yrs and that she was started on Pravastatin 10 mg once daily on May 2024 per PCP.   Lab Results  Component Value Date   CHOL 226 (H) 07/17/2022   HDL 73 07/17/2022   LDLCALC 142 (H) 07/17/2022   TRIG 65 07/17/2022   CHOLHDL 3.1 07/17/2022   Plan: Check labs today. Dawn Singh agrees to continue with statin therapy and begin our treatment plan of a heart-heathy, low cholesterol meal plan.  We will conduct routine screening as patient continues to achieve health goals along their weight loss journey    Primary hypertension Assessment: Condition is stable. Her BP is 115/78 today. She informed me that she started having hypertension problems for last few months only. She takes Cozaar 50 mg daily as prescribed by her PCP. She tolerates medication well.   Last 3 blood pressure readings in our office are as follows: BP Readings from Last 3 Encounters:  10/14/22 115/78  08/17/22 (!) 179/100  06/08/22 (!) 140/88   Plan: Check labs today. Continue Cozaar 50mg  daily. Begin prescribed meal plan and lifestyle changes such as following our low salt, heart healthy meal plan and engaging in a regular exercise program discussed. Avoid buying foods that are: processed, frozen, or prepackaged to avoid excess salt. Ambulatory blood pressure monitoring encouraged.  Reminded patient that if they ever feel poorly in any way, to check their blood pressure and pulse as well. We will begin to monitor closely. We will begin to monitor symptoms as they relate to the her weight loss journey.   Vitamin D deficiency Assessment: Condition is Not at goal. Her vitamin D level is not within the 50-80 recommended range. She takes Cholecalciferol 10,000 lU daily.  Lab Results  Component Value Date   VD25OH 41.8 07/17/2022   Plan: Check labs today. Continue Cholecalciferol 10,000 lU daily. It has been show that administration of vitamin  D supplementation leads to improved satiety and a decrease in inflammatory markers.  Hence, low Vitamin D levels may be linked to an increased risk of cardiovascular events and even increased risk of cancers- such as colon and breast. Weight loss will likely improve availability of vitamin D, thus encouraged Dawn Singh to begin with meal plan and their weight loss efforts to further improve this condition. Pt's questions and concerns regarding this condition addressed.    Vitamin B12 deficiency Assessment: Condition is stable. She started a twice daily vitamin B complex due to stress and fatigue. Pt states that this supplement helps ease these symptoms.  Lab Results  Component Value Date   VITAMINB12 563 07/17/2022   Plan: Check labs today. Continue her vitamin B complex supplements twice daily. Start her prudent nutritional plan that is B12 rich and low in simple carbohydrates, saturated fats and trans fats to goal of 5-10% weight loss to achieve significant health benefits. We will begin to monitor her condition as deemed clinically necessary.    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 For her exercise, she now walks 13,000-15,000 steps daily and endorses still having these symptoms, but to a lessening degree. She has also been taking Wegovy 0.5 mg once weekly per her PCP and thinks that the medication is helping to alleviate her frequency of loose stools. Tekoa is also conscious about avoiding carbohydrates such as rice, potatoes, and pasta as these foods cause her IBS to flare up.  Plan:  Continue to avoid trigger foods and continue following up with her  [facility] gastroenterologist annually. Will begin to monitor condition alongside specialist as it relates to her  weight loss journey.   TREATMENT PLAN FOR OBESITY: BMI 40.0-44.9, adult (HCC)- Starting BMI 10/14/22 41.34 Morbid obesity (HCC) Assessment: Condition is Not optimized. Since her information session visit on 09/17/2022, her Muscle mass has decreased by 3.6lb. Fat mass has decreased by 3.8lb.Total body water has decreased by 1.8lb.   Pt endorses taking Wegovy 0.5 mg once weekly, which is prescribed by her PCP. She states that the [facility] has been helping with quieting the food noise and calming her hunger and cravings. She is tolerating medication well, denies any GI upset.   Plan: Begin Category 2 meal plan with B and L option. Continue with Wegovy at current dose.   - I suggested cottage cheese and fruits as alternatives to snack on. I also recommended mediation to help with over eating/control.   Behavioral Intervention Additional resources provided today:  category 2 meal plan handout with B and L Evidence-based interventions for health behavior change were utilized today including the discussion of self monitoring techniques, problem-solving barriers and SMART goal setting techniques.   Regarding patient's less desirable eating habits and patterns, we employed the technique of small changes.  Pt will specifically work on: begin prescribed meal plan for next visit.    Recommended Physical Activity Goals Dawn Singh has been advised to gradually work up to 150 minutes of moderate intensity aerobic activity a week and strengthening exercises 2-3 times per week for cardiovascular health, weight loss maintenance and preservation of muscle mass.   She has agreed to continue their current level of activity  FOLLOW UP: Follow up in 2 weeks. She was informed of the importance of frequent follow up visits to maximize her success with intensive lifestyle modifications for her multiple health conditions.  Dawn Singh is aware that we will review all of her lab results at our next visit.  She is  aware that if anything is critical/ life threatening with the results, we will be contacting her via MyChart prior to the office visit to discuss management.     Chief Complaint:   OBESITY Dawn Singh (MR# [id number]) is a 52 y.o. female who presents for evaluation and treatment of obesity and related comorbidities. Current BMI is Body mass index is 41.32 kg/m. Desi has been struggling with her weight for many years and has been unsuccessful in either losing weight, maintaining weight loss, or reaching her healthy weight goal.  Dawn Singh is currently in the action stage of change and ready to dedicate time achieving and maintaining a healthier weight. Schelly is interested in becoming our patient and working on intensive lifestyle modifications including (but not limited to) diet and exercise for weight loss.  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 Patient is married to [place]  and  has 2 children, 66yr old daughter Dawn Singh and 39yr old son Dawn Singh. She lives with her husband and children.  Shatara Mair's habits were reviewed today and are as follows: her desired weight loss is to be 170lbs by December, 2024. Pt walks 60-59mins daily and gets about 13000-15000 steps daily. She informed me that she use to be a runner and notes gaining weight when she stops running and after the age of 13 she gained weight more easily. Pt states that her main issue is calorie consumption. She has tried weight watchers, Doylene Bode, and a 2020 weight loss program. She thinks she did best on weight watchers but did not enjoy it as she felt it was too time consuming to count calories. Pt rarely skips meals, eats out once a week, and occasionally snacks after dinner and at night (preferably ice cream). She informed me that she eats to stay awake. She tends to crave [place], pizza, and dairy and does not like [place], mayo, green peppers, and raw fish. She does like to cook but cannot much due to  her children's schedule so she resorts to crock pot and microwavable meals. Drink wise pt dinks coffee with milk and cream, unsweetened tea but rarely drinks alcohol. Her worst eating habit is portion control and portion sizes. Pt states that she feels constantly hungry although she drinks a lot of water to combat that. She cannot tolerate potatoes, pasta, and certain breads as it flares her IBS. She has tried yoga but states that she is not interested in it, she has not tried meditation.    Subjective:   This is the patient's first visit at [facility].  The patient's NEW PATIENT PACKET that they filled out prior to today's office visit was reviewed at length and information from that paperwork was included within the following office visit note.    Included in the packet: current and past health history, medications, allergies, ROS, gynecologic history (women only), surgical history, family history, social history, weight history, weight loss surgery history (for those that have had weight loss surgery), nutritional evaluation, mood and food questionnaire along with a depression screening (PHQ9) on all patients, an Epworth questionnaire, sleep habits questionnaire, patient life and health improvement goals questionnaire. These will all be scanned into the patient's chart under the "media" tab.   Review of Systems: Please refer to new patient packet scanned into media. Pertinent positives were addressed with patient today.  Reviewed by clinician on day of visit: allergies, medications, problem list, medical history, surgical history, family history, social history, and previous encounter notes.  During the visit, I independently reviewed the patient's EKG, bioimpedance scale results, and indirect calorimeter results. I used this information to tailor a meal plan for the patient that will help Dawn Singh to lose weight and will improve her obesity-related conditions going forward.   I performed a medically necessary appropriate examination and/or evaluation. I discussed the assessment and treatment plan with the patient. The patient was provided an opportunity to ask questions and all were answered. The patient agreed with the plan and demonstrated an understanding of the instructions. Labs were ordered today (unless patient declined them) and will be reviewed with the patient at our next visit unless more critical results need to be addressed immediately. Clinical information was updated and documented in the EMR.    Objective:   PHYSICAL EXAM: Blood pressure 115/78, pulse 84, temperature 98.1 F (36.7 C), height 5\' 6"  (1.676 m), weight 256 lb (116.1  kg), SpO2 98 %. Body mass index is 41.32 kg/m. General: Well Developed, well nourished, and in no acute distress.  HEENT: Normocephalic, atraumatic Skin: Warm and dry, cap RF less 2 sec, good turgor Chest:  Normal excursion, shape, no gross abn Respiratory: speaking in full sentences, no conversational dyspnea NeuroM-Sk: Ambulates w/o assistance, moves * 4 Psych: A and O *3, insight good, mood-full  Anthropometric Measurements Height: 5\' 6"  (1.676 m) Weight: 256 lb (116.1 kg) BMI (Calculated): 41.34 Weight at Last Visit: na Weight Lost Since Last Visit: na Weight Gained Since Last Visit: na Starting Weight: 256lb Total Weight Loss (lbs): 0 lb (0 kg) Peak Weight: 272lb Waist Measurement : 44 inches   Body Composition  Body Fat %: 47 % Fat Mass (lbs): 120.4 lbs Muscle Mass (lbs): 129 lbs Total Body Water (lbs): 91.6 lbs Visceral Fat Rating : 14   Other Clinical Data RMR: [id number] Fasting: yes Labs: yes Today's Visit #: first visit Starting Date: 10/14/22    DIAGNOSTIC DATA REVIEWED:  BMET    Component Value Date/Time   NA 140 07/17/2022 0711   K 4.2 07/17/2022 0711   CL 103 07/17/2022 0711   CO2 22 07/17/2022 0711   GLUCOSE 95 07/17/2022 0711   GLUCOSE 107 (H) 02/08/2020 0832   BUN 15 07/17/2022  0711   CREATININE 0.75 07/17/2022 0711   CALCIUM 9.2 07/17/2022 0711   GFRNONAA >60 02/08/2020 0832   No results found for: "HGBA1C" No results found for: "INSULIN" Lab Results  Component Value Date   TSH 1.940 07/17/2022   CBC    Component Value Date/Time   WBC 6.6 02/08/2020 0832   RBC 4.72 02/08/2020 0832   HGB 14.1 02/08/2020 0832   HCT 42.0 02/08/2020 0832   PLT 209 02/08/2020 0832   MCV 89.0 02/08/2020 0832   MCH 29.9 02/08/2020 0832   MCHC 33.6 02/08/2020 0832   RDW 13.1 02/08/2020 0832   Iron Studies No results found for: "IRON", "TIBC", "FERRITIN", "IRONPCTSAT" Lipid Panel     Component Value Date/Time   CHOL 226 (H) 07/17/2022 0711   TRIG 65 07/17/2022 0711   HDL 73 07/17/2022 0711   CHOLHDL 3.1 07/17/2022 0711   LDLCALC 142 (H) 07/17/2022 0711   Hepatic Function Panel  No results found for: "PROT", "ALBUMIN", "AST", "ALT", "ALKPHOS", "BILITOT", "BILIDIR", "IBILI"    Component Value Date/Time   TSH 1.940 07/17/2022 0711   Nutritional Lab Results  Component Value Date   VD25OH 41.8 07/17/2022     Attestation Statements:   I, Special Puri, acting as a Stage manager for [employer], DO., have compiled all relevant documentation for today's office visit on behalf of Thomasene Lot, DO, while in the presence of [employer], DO.  Time spent on visit including pre-visit chart review and post-visit care was estimated to be 65 minutes. Over 50% of the time was spent in direct face to face counseling and coordination of care.  I have reviewed the above documentation for accuracy and completeness, and I agree with the above. Dawn Singh, D.O.  The 21st Century Cures Act was signed into law in 2016 which includes the topic of electronic health records.  This provides immediate access to information in MyChart.  This includes consultation notes, operative notes, office notes, lab results and pathology reports.  If you have any questions about what  you read please let us know at your next visit so we can discuss your concerns and take corrective action if need  be.  We are right here with you.

## 2022-10-20 LAB — CBC WITH DIFFERENTIAL/PLATELET
Basophils Absolute: 0.1 10*3/uL (ref 0.0–0.2)
Basos: 1 %
EOS (ABSOLUTE): 0.1 10*3/uL (ref 0.0–0.4)
Eos: 1 %
Hematocrit: 45.4 % (ref 34.0–46.6)
Hemoglobin: 15 g/dL (ref 11.1–15.9)
Immature Grans (Abs): 0 10*3/uL (ref 0.0–0.1)
Immature Granulocytes: 0 %
Lymphocytes Absolute: 1.4 10*3/uL (ref 0.7–3.1)
Lymphs: 18 %
MCH: 28.8 pg (ref 26.6–33.0)
MCHC: 33 g/dL (ref 31.5–35.7)
MCV: 87 fL (ref 79–97)
Monocytes Absolute: 0.5 10*3/uL (ref 0.1–0.9)
Monocytes: 6 %
Neutrophils Absolute: 5.9 10*3/uL (ref 1.4–7.0)
Neutrophils: 74 %
Platelets: 284 10*3/uL (ref 150–450)
RBC: 5.21 x10E6/uL (ref 3.77–5.28)
RDW: 13.3 % (ref 11.7–15.4)
WBC: 7.9 10*3/uL (ref 3.4–10.8)

## 2022-10-20 LAB — COMPREHENSIVE METABOLIC PANEL
ALT: 18 IU/L (ref 0–32)
AST: 18 IU/L (ref 0–40)
Albumin: 4.7 g/dL (ref 3.8–4.9)
Alkaline Phosphatase: 79 IU/L (ref 44–121)
BUN/Creatinine Ratio: 24 — ABNORMAL HIGH (ref 9–23)
BUN: 17 mg/dL (ref 6–24)
Bilirubin Total: 0.4 mg/dL (ref 0.0–1.2)
CO2: 20 mmol/L (ref 20–29)
Calcium: 9.7 mg/dL (ref 8.7–10.2)
Chloride: 100 mmol/L (ref 96–106)
Creatinine, Ser: 0.72 mg/dL (ref 0.57–1.00)
Globulin, Total: 2.2 g/dL (ref 1.5–4.5)
Glucose: 67 mg/dL — ABNORMAL LOW (ref 70–99)
Potassium: 4.5 mmol/L (ref 3.5–5.2)
Sodium: 141 mmol/L (ref 134–144)
Total Protein: 6.9 g/dL (ref 6.0–8.5)
eGFR: 101 mL/min/{1.73_m2} (ref >59)

## 2022-10-20 LAB — VITAMIN D 25 HYDROXY (VIT D DEFICIENCY, FRACTURES): Vit D, 25-Hydroxy: 77.3 ng/mL (ref 30.0–100.0)

## 2022-10-20 LAB — FOLATE: Folate: 13.1 ng/mL (ref >3.0)

## 2022-10-20 LAB — TSH: TSH: 1.2 u[IU]/mL (ref 0.450–4.500)

## 2022-10-20 LAB — LIPID PANEL WITH LDL/HDL RATIO
Cholesterol, Total: 196 mg/dL (ref 100–199)
HDL: 66 mg/dL (ref >39)
LDL Chol Calc (NIH): 118 mg/dL — ABNORMAL HIGH (ref 0–99)
LDL/HDL Ratio: 1.8 ratio (ref 0.0–3.2)
Triglycerides: 63 mg/dL (ref 0–149)
VLDL Cholesterol Cal: 12 mg/dL (ref 5–40)

## 2022-10-20 LAB — HEMOGLOBIN A1C
Est. average glucose Bld gHb Est-mCnc: 111 mg/dL
Hgb A1c MFr Bld: 5.5 % (ref 4.8–5.6)

## 2022-10-20 LAB — VITAMIN B12: Vitamin B-12: 661 pg/mL (ref 232–1245)

## 2022-10-20 LAB — T4, FREE: Free T4: 1.19 ng/dL (ref 0.82–1.77)

## 2022-10-20 LAB — INSULIN, RANDOM: INSULIN: 8 u[IU]/mL (ref 2.6–24.9)

## 2022-10-28 ENCOUNTER — Ambulatory Visit (INDEPENDENT_AMBULATORY_CARE_PROVIDER_SITE_OTHER): Payer: Managed Care, Other (non HMO) | Admitting: Family Medicine

## 2022-10-28 ENCOUNTER — Encounter (INDEPENDENT_AMBULATORY_CARE_PROVIDER_SITE_OTHER): Payer: Self-pay | Admitting: Family Medicine

## 2022-10-28 VITALS — BP 125/83 | HR 77 | Temp 98.1°F | Ht 66.0 in | Wt 251.0 lb

## 2022-10-28 DIAGNOSIS — E782 Mixed hyperlipidemia: Secondary | ICD-10-CM

## 2022-10-28 DIAGNOSIS — E559 Vitamin D deficiency, unspecified: Secondary | ICD-10-CM

## 2022-10-28 DIAGNOSIS — I10 Essential (primary) hypertension: Secondary | ICD-10-CM | POA: Diagnosis not present

## 2022-10-28 DIAGNOSIS — Z6841 Body Mass Index (BMI) 40.0 and over, adult: Secondary | ICD-10-CM

## 2022-10-28 NOTE — Progress Notes (Signed)
Dawn Singh, D.O.  ABFM, ABOM Clinical Bariatric Medicine Physician  Office located at: [address] [place], [place]  [address]     Assessment and Plan:   Mixed hyperlipidemia Assessment: Condition is Not at goal. Pt is compliant and continues to take Pravastatin daily. She tolerates this well with no difficulty and no adverse side effects.  Lab Results  Component Value Date   CHOL 196 10/14/2022   HDL 66 10/14/2022   LDLCALC 118 (H) 10/14/2022   TRIG 63 10/14/2022   CHOLHDL 3.1 07/17/2022   Lab Results  Component Value Date   FOLATE 13.1 10/14/2022   Lab Results  Component Value Date   CREATININE 0.72 10/14/2022   BUN 17 10/14/2022   NA 141 10/14/2022   K 4.5 10/14/2022   CL 100 10/14/2022   CO2 20 10/14/2022      Component Value Date/Time   PROT 6.9 10/14/2022 1354   ALBUMIN 4.7 10/14/2022 1354   AST 18 10/14/2022 1354   ALT 18 10/14/2022 1354   ALKPHOS 79 10/14/2022 1354   BILITOT 0.4 10/14/2022 1354   Lab Results  Component Value Date   HGBA1C 5.5 10/14/2022   INSULIN 8.0 10/14/2022   Lab Results  Component Value Date   TSH 1.200 10/14/2022   Plan: - Continue Pravastatin 10mg  daily at current dose.    - We extensively discussed several lifestyle modifications today and she will continue to work on diet, exercise and weight loss efforts.   - We recommend: aerobic activity with eventual goal of a minimum of 150+ min wk plus 2 days/ week of resistance or strength training.    - We will continue routine screening as patient continues to achieve health goals along their weight loss journey  - I stressed the importance that patient continue with our prudent nutritional plan that is low in saturated and trans fats, and low in fatty carbs to improve these numbers. - Continue to decrease simple carbs/ sugars; increase fiber and proteins -> follow her meal plan.    - Anticipatory guidance given.    - Labs were reviewed with patient today and  education provided on them. We discussed how the foods patient eats may influence these laboratory findings.  All of the patient's questions about them were answered    Primary hypertension Assessment: Condition is stable today. She is complaint and currently taking Cozaar. She tolerates this without difficulty and has no adverse side effects. Her last 2 readings were stable.   Last 3 blood pressure readings in our office are as follows: BP Readings from Last 3 Encounters:  10/28/22 125/83  10/14/22 115/78  08/17/22 (!) 179/100   Plan: BP is 125/83 at goal today. Continue with Cozaar  50mg  daily as directed by her PCP.   - Counseled Sherre Scarlet on pathophysiology of disease and discussed treatment plan, which always includes dietary and lifestyle modification as first line.   - Lifestyle changes such as following our low salt, heart healthy foods, and engaging in a regular exercise is encouraged.  - We will continue to monitor symptoms closely alongside PCP/ specialists as they relate to the her weight loss journey.   Vitamin D deficiency Assessment: Condition is At goal. Pt currently takes OTC cholecalciferol daily with no adverse side effects and is tolerating this well.  Lab Results  Component Value Date   VD25OH 77.3 10/14/2022   VD25OH 41.8 07/17/2022   Plan: - Continue cholecalciferol 10,000 lU daily.  Labs were reviewed with patient today and education provided on them. We discussed how the foods patient eats may influence these laboratory findings.  All of the patient's questions about them were answered   - We will continue to monitor levels regularly (every 3-4 mo on average) to keep levels within normal limits and prevent over supplementation.   TREATMENT PLAN FOR OBESITY: BMI 40.0-44.9, adult (HCC)- Starting BMI 10/14/22 41.34 Morbid obesity (HCC) Assessment:  [name] is here to discuss her progress with her obesity treatment plan along with follow-up  of her obesity related diagnoses. See Medical Weight Management Flowsheet for complete bioelectrical impedance results.  - She was started on Wegovy last OV and is now up to 0.5. She tolerates this well and states that this has "completely changed her life". Starting the [facility] has resolved her IBS currently.   Condition is not optimized. Biometric data collected today, was reviewed with patient.   Since last office visit patient's  Muscle mass has decreased by 2.4lb. Fat mass has decreased by 2.9lb. Total body water has increased by 1.4lb.  Counseling done on how various foods will affect these numbers and how to maximize success.   Total lbs lost to date: 5lbs Total weight loss percentage to date: 1.95%   Plan: - Continue to adhere with Category 2 meal plan with B and L option.   - Continue Wegovy 0.5mg  weekly.     Behavioral Intervention Additional resources provided today:  insulin resistance handout Evidence-based interventions for health behavior change were utilized today including the discussion of self monitoring techniques, problem-solving barriers and SMART goal setting techniques.   Regarding patient's less desirable eating habits and patterns, we employed the technique of small changes.  Pt will specifically work on: following the prescribed meal plan for next visit.    Recommended Physical Activity Goals  Gage has been advised to slowly work up to 150 minutes of moderate intensity aerobic activity a week and strengthening exercises 2-3 times per week for cardiovascular health, weight loss maintenance and preservation of muscle mass.   She has agreed to Continue current level of physical activity   FOLLOW UP: No follow-ups on file. She was informed of the importance of frequent follow up visits to maximize her success with intensive lifestyle modifications for her multiple health conditions.  Subjective:   Chief complaint: Obesity [name] is here to discuss her  progress with her obesity treatment plan. She is on the the Category 2 Plan with B and L options and states she is following her eating plan approximately 90 % of the time. She states she is walks 4 miles for an average of 70 minutes 7 days per week.  Interval History:  Declyn Offield is here today for her first follow-up office visit since starting the program with [place].  Since last OV she has been doing well.   - Her hunger and cravings controlled.   - Doesn't measure food.   - She continues to travel a lot.   - She has recently started to incorporate running and is tolerating that well with her.    Pharmacotherapy for weight loss: She is currently taking Wegovy for medical weight loss.  Denies side effects.    Review of Systems:  Pertinent positives were addressed with patient today.   Weight Summary and Biometrics   Weight Lost Since Last Visit: 5lb  Weight Gained Since Last Visit: 0lb   Vitals Temp: 98.1 F (36.7 C) BP: 125/83 Pulse Rate: 77 SpO2:  98 %   Anthropometric Measurements Height: 5\' 6"  (1.676 m) Weight: 251 lb (113.9 kg) BMI (Calculated): 40.53 Weight at Last Visit: 256lb Weight Lost Since Last Visit: 5lb Weight Gained Since Last Visit: 0lb Starting Weight: 256lb Total Weight Loss (lbs): 5 lb (2.268 kg) Peak Weight: 272lb   Body Composition  Body Fat %: 46.9 % Fat Mass (lbs): 117.8 lbs Muscle Mass (lbs): 126.6 lbs Total Body Water (lbs): 93 lbs Visceral Fat Rating : 14   Other Clinical Data Fasting: no Labs: no Today's Visit #: 2 Starting Date: 10/14/22   Objective:   PHYSICAL EXAM:  Blood pressure 125/83, pulse 77, temperature 98.1 F (36.7 C), height 5\' 6"  (1.676 m), weight 251 lb (113.9 kg), SpO2 98%. Body mass index is 40.51 kg/m.  General: Well Developed, well nourished, and in no acute distress.  HEENT: Normocephalic, atraumatic Skin: Warm and dry, cap RF less 2 sec, good turgor Chest:  Normal excursion, shape, no gross  abn Respiratory: speaking in full sentences, no conversational dyspnea NeuroM-Sk: Ambulates w/o assistance, moves * 4 Psych: A and O *3, insight good, mood-full  DIAGNOSTIC DATA REVIEWED:  BMET    Component Value Date/Time   NA 141 10/14/2022 1354   K 4.5 10/14/2022 1354   CL 100 10/14/2022 1354   CO2 20 10/14/2022 1354   GLUCOSE 67 (L) 10/14/2022 1354   GLUCOSE 107 (H) 02/08/2020 0832   BUN 17 10/14/2022 1354   CREATININE 0.72 10/14/2022 1354   CALCIUM 9.7 10/14/2022 1354   GFRNONAA >60 02/08/2020 0832   Lab Results  Component Value Date   HGBA1C 5.5 10/14/2022   Lab Results  Component Value Date   INSULIN 8.0 10/14/2022   Lab Results  Component Value Date   TSH 1.200 10/14/2022   CBC    Component Value Date/Time   WBC 7.9 10/14/2022 1354   WBC 6.6 02/08/2020 0832   RBC 5.21 10/14/2022 1354   RBC 4.72 02/08/2020 0832   HGB 15.0 10/14/2022 1354   HCT 45.4 10/14/2022 1354   PLT 284 10/14/2022 1354   MCV 87 10/14/2022 1354   MCH 28.8 10/14/2022 1354   MCH 29.9 02/08/2020 0832   MCHC 33.0 10/14/2022 1354   MCHC 33.6 02/08/2020 0832   RDW 13.3 10/14/2022 1354   Iron Studies No results found for: "IRON", "TIBC", "FERRITIN", "IRONPCTSAT" Lipid Panel     Component Value Date/Time   CHOL 196 10/14/2022 1354   TRIG 63 10/14/2022 1354   HDL 66 10/14/2022 1354   CHOLHDL 3.1 07/17/2022 0711   LDLCALC 118 (H) 10/14/2022 1354   Hepatic Function Panel     Component Value Date/Time   PROT 6.9 10/14/2022 1354   ALBUMIN 4.7 10/14/2022 1354   AST 18 10/14/2022 1354   ALT 18 10/14/2022 1354   ALKPHOS 79 10/14/2022 1354   BILITOT 0.4 10/14/2022 1354      Component Value Date/Time   TSH 1.200 10/14/2022 1354   Nutritional Lab Results  Component Value Date   VD25OH 77.3 10/14/2022   VD25OH 41.8 07/17/2022    Attestations:  Patient was in the office today and time spent on visit including pre-visit chart review and post-visit care/coordination of care and  electronic medical record documentation was 40 minutes. 50% of the time was in face to face counseling of this patient's medical condition(s) and providing education on treatment options to include the first-line treatment of diet and lifestyle modification.  Reviewed by clinician on day of visit: allergies, medications, problem  list, medical history, surgical history, family history, social history, and previous encounter notes.   I, Clinical biochemist, acting as a Stage manager for [employer], DO., have compiled all relevant documentation for today's office visit on behalf of Thomasene Lot, DO, while in the presence of [employer], DO.  I have reviewed the above documentation for accuracy and completeness, and I agree with the above. Dawn Singh, D.O.  The 21st Century Cures Act was signed into law in 2016 which includes the topic of electronic health records.  This provides immediate access to information in MyChart.  This includes consultation notes, operative notes, office notes, lab results and pathology reports.  If you have any questions about what you read please let us know at your next visit so we can discuss your concerns and take corrective action if need be.  We are right here with you.

## 2022-11-04 ENCOUNTER — Encounter (INDEPENDENT_AMBULATORY_CARE_PROVIDER_SITE_OTHER): Payer: Self-pay

## 2022-11-15 NOTE — Progress Notes (Signed)
.smr  Office: [phone]  /  Fax: [phone]  WEIGHT SUMMARY AND BIOMETRICS  Vitals BP: 118/73 Pulse Rate: 78 SpO2: 97 %   Anthropometric Measurements Height: 5\' 6"  (1.676 m) Weight: 242 lb (109.8 kg) BMI (Calculated): 39.08 Weight at Last Visit: 251 lb Weight Lost Since Last Visit: 9 lb Weight Gained Since Last Visit: 0 Starting Weight: 256 lb Total Weight Loss (lbs): 14 lb (6.35 kg)   Body Composition  Body Fat %: 45.1 % Fat Mass (lbs): 109.4 lbs Muscle Mass (lbs): 126.4 lbs Total Body Water (lbs): 89.6 lbs Visceral Fat Rating : 13   Other Clinical Data Fasting: Yes Labs: No Today's Visit #: 3 Starting Date: 10/14/22     HPI  Chief Complaint: OBESITY  Dawn Singh is here to discuss her progress with her obesity treatment plan. She is on the the Category 2 Plan and states she is following her eating plan approximately 90 % of the time. She states she is exercising walking/running 60 minutes 7 times per week.   Interval History:  Since last office visit she down 9 lbs.  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  She has been amazed at the changes.  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   Hunger/appetite-well controlled/ Not skipping meals Cravings- denies excessive cravings Stress- manageable Sleep- New disruption of sleep which has not been a problem in the past. ? Medication related. She is seeing her PCP later this week and will discuss further with her PCP as multiple medications started at the same time. Has IUD for birth control, but also likely peri- menopausal which may contribute to sleep issues. Takes melatonin when traveling.  Has frequent international travel coming up and will have to follow up less over the next month.  Exercise-Walking/running each morning.  Hydration-adequate Protein intake- adequate   Pharmacotherapy: Wegovy 0.5 mg weekly. Denies mass in neck, dysphagia,  dyspepsia, persistent hoarseness, abdominal pain, or N/V/Constipation or diarrhea. Has annual eye exam and just recently had exam. Mood is stable. PCP is prescribing all medication including Wegovy.  Reports remarkable improvement in colitis symptoms and has not swung in the other direction to constipation. This is life changing for the patient.   TREATMENT PLAN FOR OBESITY: Would continue Wegovy 0.5 mg weekly at this point as responding well and no side effects.  Total weight loss 14 lbs TBW loss 5.5%  Recommended Dietary Goals  Dawn Singh is currently in the action stage of change. As such, her goal is to continue weight management plan. She has agreed to the Category 2 Plan.  Behavioral Intervention  We discussed the following Behavioral Modification Strategies today: increasing lean protein intake, decreasing simple carbohydrates , increasing vegetables, increasing lower glycemic fruits, avoiding skipping meals, increasing water intake, work on managing stress, creating time for self-care and relaxation measures, continue to practice mindfulness when eating, and planning for success.  Additional resources provided today: NA  Recommended Physical Activity Goals  Dawn Singh has been advised to work up to 150 minutes of moderate intensity aerobic activity a week and strengthening exercises 2-3 times per week for cardiovascular health, weight loss maintenance and preservation of muscle mass.   She has agreed to Continue current level of physical activity    Pharmacotherapy We discussed various medication options to help Dawn Singh with her weight loss efforts and we both agreed to continue Wegovy 0.5 mg weekly.    Return in about 4 weeks (around 12/14/2022).Marland Kitchen She was  informed of the importance of frequent follow up visits to maximize her success with intensive lifestyle modifications for her multiple health conditions.  PHYSICAL EXAM:  Blood pressure 118/73, pulse 78, height 5\' 6"  (1.676 m),  weight 242 lb (109.8 kg), SpO2 97%. Body mass index is 39.06 kg/m.  General: She is overweight, cooperative, alert, well developed, and in no acute distress. PSYCH: Has normal mood, affect and thought process.   Cardiovascular: HR 70's regular, BP 118/73 Lungs: Normal breathing effort, no conversational dyspnea. Neuro: no focal deficits   DIAGNOSTIC DATA REVIEWED:  BMET    Component Value Date/Time   NA 141 10/14/2022 1354   K 4.5 10/14/2022 1354   CL 100 10/14/2022 1354   CO2 20 10/14/2022 1354   GLUCOSE 67 (L) 10/14/2022 1354   GLUCOSE 107 (H) 02/08/2020 0832   BUN 17 10/14/2022 1354   CREATININE 0.72 10/14/2022 1354   CALCIUM 9.7 10/14/2022 1354   GFRNONAA >60 02/08/2020 0832   Lab Results  Component Value Date   HGBA1C 5.5 10/14/2022   Lab Results  Component Value Date   INSULIN 8.0 10/14/2022   Lab Results  Component Value Date   TSH 1.200 10/14/2022   CBC    Component Value Date/Time   WBC 7.9 10/14/2022 1354   WBC 6.6 02/08/2020 0832   RBC 5.21 10/14/2022 1354   RBC 4.72 02/08/2020 0832   HGB 15.0 10/14/2022 1354   HCT 45.4 10/14/2022 1354   PLT 284 10/14/2022 1354   MCV 87 10/14/2022 1354   MCH 28.8 10/14/2022 1354   MCH 29.9 02/08/2020 0832   MCHC 33.0 10/14/2022 1354   MCHC 33.6 02/08/2020 0832   RDW 13.3 10/14/2022 1354   Iron Studies No results found for: "IRON", "TIBC", "FERRITIN", "IRONPCTSAT" Lipid Panel     Component Value Date/Time   CHOL 196 10/14/2022 1354   TRIG 63 10/14/2022 1354   HDL 66 10/14/2022 1354   CHOLHDL 3.1 07/17/2022 0711   LDLCALC 118 (H) 10/14/2022 1354   Hepatic Function Panel     Component Value Date/Time   PROT 6.9 10/14/2022 1354   ALBUMIN 4.7 10/14/2022 1354   AST 18 10/14/2022 1354   ALT 18 10/14/2022 1354   ALKPHOS 79 10/14/2022 1354   BILITOT 0.4 10/14/2022 1354      Component Value Date/Time   TSH 1.200 10/14/2022 1354   Nutritional Lab Results  Component Value Date   VD25OH 77.3 10/14/2022    VD25OH 41.8 07/17/2022    ASSOCIATED CONDITIONS ADDRESSED TODAY  ASSESSMENT AND PLAN  Problem List Items Addressed This Visit     Vitamin D deficiency   Mixed hyperlipidemia - Primary   Primary hypertension   Other Visit Diagnoses     Obesity (HCC)- Starting BMI 10/14/22 41.34       Relevant Medications   WEGOVY 0.5 MG/0.5ML SOAJ   BMI 39.0-39.9,adult Current BMI 39.2          Hyperlipidemia LDL is not at goal. Medication(s): pravastatin 10 mg daily. No side effects. And [facility] prescribed by PCP.  Cardiovascular risk factors: dyslipidemia, hypertension, and obesity (BMI >= 30 kg/m2)  Lab Results  Component Value Date   CHOL 196 10/14/2022   HDL 66 10/14/2022   LDLCALC 118 (H) 10/14/2022   TRIG 63 10/14/2022   CHOLHDL 3.1 07/17/2022   Lab Results  Component Value Date   ALT 18 10/14/2022   AST 18 10/14/2022   ALKPHOS 79 10/14/2022   BILITOT 0.4 10/14/2022  The 10-year ASCVD risk score (Arnett DK, et al., 2019) is: 1.4%   Values used to calculate the score:     Age: 52 years     Sex: Female     Is Non-Hispanic African American: No     Diabetic: No     Tobacco smoker: No     Systolic Blood Pressure: 118 mmHg     Is BP treated: Yes     HDL Cholesterol: 66 mg/dL     Total Cholesterol: 196 mg/dL  Plan: Continue pravastatin.  Continue working on nutrition plan to promote weight loss and improve lipid profile/decrease CV risk. On Wegovy and doing well. Should except improvement in lipid panel with GLP-1 RA medication as well.      Vitamin D Deficiency Vitamin D is at goal of 50.  Most recent vitamin D level was 77.3. She is on 10,000 units daily. . Lab Results  Component Value Date   VD25OH 77.3 10/14/2022   VD25OH 41.8 07/17/2022    Plan: Continue vitamin D 3 10,000 units daily.  May be able to decrease dose soon.  Low vitamin D levels can be associated with adiposity and may result in leptin resistance and weight gain. Also associated with  fatigue. Currently on vitamin D supplementation without any adverse effects.    Hypertension Hypertension well controlled, improved, asymptomatic, and no significant medication side effects noted.  Medication(s): losartan 50 mg daily.  Renal function normal.   BP Readings from Last 3 Encounters:  11/16/22 118/73  10/28/22 125/83  10/14/22 115/78   Lab Results  Component Value Date   CREATININE 0.72 10/14/2022   CREATININE 0.75 07/17/2022   CREATININE 0.75 02/08/2020   No results found for: "GFR"  Plan: Continue all antihypertensives at current dosages. Continue to work on nutrition plan to promote weight loss and improve BP control. Monitor BP closely as may be able to taper or possibly DC medication following weight loss and treatment with GLP-1RA  medication, Wegovy.    ATTESTASTION STATEMENTS:  Reviewed by clinician on day of visit: allergies, medications, problem list, medical history, surgical history, family history, social history, and previous encounter notes.   I have personally spent 35 minutes total time today in preparation, patient care, nutritional counseling and documentation for this visit, including the following: review of clinical lab tests; review of medical tests/procedures/services.      Korbin Mapps, PA-C

## 2022-11-16 ENCOUNTER — Ambulatory Visit (INDEPENDENT_AMBULATORY_CARE_PROVIDER_SITE_OTHER): Payer: Managed Care, Other (non HMO) | Admitting: Physician Assistant

## 2022-11-16 ENCOUNTER — Encounter (INDEPENDENT_AMBULATORY_CARE_PROVIDER_SITE_OTHER): Payer: Self-pay | Admitting: Physician Assistant

## 2022-11-16 VITALS — BP 118/73 | HR 78 | Ht 66.0 in | Wt 242.0 lb

## 2022-11-16 DIAGNOSIS — E669 Obesity, unspecified: Secondary | ICD-10-CM | POA: Diagnosis not present

## 2022-11-16 DIAGNOSIS — E559 Vitamin D deficiency, unspecified: Secondary | ICD-10-CM

## 2022-11-16 DIAGNOSIS — I10 Essential (primary) hypertension: Secondary | ICD-10-CM

## 2022-11-16 DIAGNOSIS — Z6839 Body mass index (BMI) 39.0-39.9, adult: Secondary | ICD-10-CM

## 2022-11-16 DIAGNOSIS — E782 Mixed hyperlipidemia: Secondary | ICD-10-CM

## 2022-11-18 ENCOUNTER — Ambulatory Visit: Payer: Managed Care, Other (non HMO) | Admitting: Family

## 2022-11-18 VITALS — BP 116/86 | HR 80 | Temp 98.3°F | Ht 66.0 in | Wt 244.8 lb

## 2022-11-18 DIAGNOSIS — E782 Mixed hyperlipidemia: Secondary | ICD-10-CM

## 2022-11-18 DIAGNOSIS — I10 Essential (primary) hypertension: Secondary | ICD-10-CM | POA: Diagnosis not present

## 2022-11-18 DIAGNOSIS — Z6841 Body Mass Index (BMI) 40.0 and over, adult: Secondary | ICD-10-CM

## 2022-11-18 DIAGNOSIS — Z9189 Other specified personal risk factors, not elsewhere classified: Secondary | ICD-10-CM | POA: Diagnosis not present

## 2022-11-18 MED ORDER — WEGOVY 1 MG/0.5ML ~~LOC~~ SOAJ
1.0000 mg | SUBCUTANEOUS | 2 refills | Status: DC
Start: 2022-11-18 — End: 2023-02-04

## 2022-11-18 NOTE — Patient Instructions (Signed)
  Increase your wegovy to 1 mg weekly.

## 2022-11-18 NOTE — Assessment & Plan Note (Signed)
Continue losartan 50 mg once daily  BP has been improved.

## 2022-11-18 NOTE — Assessment & Plan Note (Addendum)
Pt advised to work on diet and exercise as tolerated Increase wegovy to 1 mg weekly  Continue f/u as scheduled with weight loss wellness

## 2022-11-18 NOTE — Progress Notes (Signed)
Established Patient Office Visit  Subjective:      CC:  Chief Complaint  Patient presents with   Medical Management of Chronic Issues    HPI: Dawn Singh is a 52 y.o. female presenting on 11/18/2022 for Medical Management of Chronic Issues . Obesity: going to healthy weight and wellness . Tolerating wegovy 0.5 mg weekly.  Wt Readings from Last 3 Encounters:  11/18/22 244 lb 12.8 oz (111 kg)  11/16/22 242 lb (109.8 kg)  10/28/22 251 lb (113.9 kg)    Lab Results  Component Value Date   CHOL 196 10/14/2022   HDL 66 10/14/2022   LDLCALC 118 (H) 10/14/2022   TRIG 63 10/14/2022   CHOLHDL 3.1 07/17/2022   HTN: today blood pressure 116/86. On losartan 50 mg once daily. At home average is around 120/75   Wt Readings from Last 3 Encounters:  11/18/22 244 lb 12.8 oz (111 kg)  11/16/22 242 lb (109.8 kg)  10/28/22 251 lb (113.9 kg)   Temp Readings from Last 3 Encounters:  11/18/22 98.3 F (36.8 C) (Oral)  10/28/22 98.1 F (36.7 C)  10/14/22 98.1 F (36.7 C)   BP Readings from Last 3 Encounters:  11/18/22 116/86  11/16/22 118/73  10/28/22 125/83   Pulse Readings from Last 3 Encounters:  11/18/22 80  11/16/22 78  10/28/22 77         Social history:  Relevant past medical, surgical, family and social history reviewed and updated as indicated. Interim medical history since our last visit reviewed.  Allergies and medications reviewed and updated.  DATA REVIEWED: CHART IN EPIC     ROS: Negative unless specifically indicated above in HPI.    Current Outpatient Medications:    Semaglutide-Weight Management (WEGOVY) 1 MG/0.5ML SOAJ, Inject 1 mg into the skin once a week., Disp: 2 mL, Rfl: 2   b complex vitamins capsule, Take 2 capsules by mouth daily., Disp: , Rfl:    Cholecalciferol (VITAMIN D3) 250 MCG (10000 UT) capsule, Take 10,000 Units by mouth daily., Disp: , Rfl:    ibuprofen (ADVIL) 200 MG tablet, Take 400 mg by mouth every 6 (six) hours as  needed for headache or moderate pain., Disp: , Rfl:    losartan (COZAAR) 50 MG tablet, Take 1 tablet (50 mg total) by mouth daily., Disp: 90 tablet, Rfl: 3   Melatonin 1 MG CAPS, Take 1 mg by mouth at bedtime. PRN when traveling, Disp: , Rfl:    pravastatin (PRAVACHOL) 10 MG tablet, Take 1 tablet (10 mg total) by mouth daily., Disp: 90 tablet, Rfl: 3      Objective:    BP 116/86   Pulse 80   Temp 98.3 F (36.8 C) (Oral)   Ht 5\' 6"  (1.676 m)   Wt 244 lb 12.8 oz (111 kg)   SpO2 98%   BMI 39.51 kg/m   Wt Readings from Last 3 Encounters:  11/18/22 244 lb 12.8 oz (111 kg)  11/16/22 242 lb (109.8 kg)  10/28/22 251 lb (113.9 kg)    Physical Exam Constitutional:      General: She is not in acute distress.    Appearance: Normal appearance. She is obese. She is not ill-appearing, toxic-appearing or diaphoretic.  HENT:     Head: Normocephalic.  Cardiovascular:     Rate and Rhythm: Normal rate.  Pulmonary:     Effort: Pulmonary effort is normal.  Musculoskeletal:        General: Normal range of motion.  Neurological:  General: No focal deficit present.     Mental Status: She is alert and oriented to person, place, and time. Mental status is at baseline.  Psychiatric:        Mood and Affect: Mood normal.        Behavior: Behavior normal.        Thought Content: Thought content normal.        Judgment: Judgment normal.           Assessment & Plan:  Class 3 severe obesity due to excess calories with serious comorbidity (BMI) of 40.0 to 44.9 in adult [facility]) Assessment & Plan: Pt advised to work on diet and exercise as tolerated Increase wegovy to 1 mg weekly  Continue f/u as scheduled with weight loss wellness  Orders: -     IHKVQQ; Inject 1 mg into the skin once a week.  Dispense: 2 mL; Refill: 2  At risk for diabetes mellitus -     Wegovy; Inject 1 mg into the skin once a week.  Dispense: 2 mL; Refill: 2  Primary hypertension Assessment & Plan: Continue losartan  50 mg once daily  BP has been improved.     Mixed hyperlipidemia Assessment & Plan: Work on low cholesterol diet and exercise as tolerated       Return in about 6 months (around 05/21/2023) for f/u weight loss medication.  [name], MSN, APRN, FNP-C [facility]

## 2022-11-18 NOTE — Assessment & Plan Note (Signed)
Work on low cholesterol diet and exercise as tolerated  

## 2022-12-04 ENCOUNTER — Encounter (INDEPENDENT_AMBULATORY_CARE_PROVIDER_SITE_OTHER): Payer: Self-pay | Admitting: Physician Assistant

## 2022-12-14 ENCOUNTER — Ambulatory Visit (INDEPENDENT_AMBULATORY_CARE_PROVIDER_SITE_OTHER): Payer: Managed Care, Other (non HMO) | Admitting: Family Medicine

## 2023-01-10 NOTE — Progress Notes (Unsigned)
.smr  Office: [phone]  /  Fax: [phone]  WEIGHT SUMMARY AND BIOMETRICS  Vitals Temp: 98.1 F (36.7 C) BP: 105/68 Pulse Rate: 91 SpO2: 98 %   Anthropometric Measurements Height: 5\' 6"  (1.676 m) Weight: 234 lb (106.1 kg) BMI (Calculated): 37.79 Weight at Last Visit: 242 lb Weight Lost Since Last Visit: 8 lb Weight Gained Since Last Visit: 0 Starting Weight: 256 lb Total Weight Loss (lbs): 22 lb (9.979 kg) Peak Weight: 278 lb   Body Composition  Body Fat %: 42.5 % Fat Mass (lbs): 99.6 lbs Muscle Mass (lbs): 128 lbs Total Body Water (lbs): 89.2 lbs Visceral Fat Rating : 12   Other Clinical Data Fasting: yes Labs: no Today's Visit #: 4 Starting Date: 10/14/22     HPI  Chief Complaint: OBESITY  Dawn Singh is here to discuss her progress with her obesity treatment plan. She is on the the Category 2 Plan and states she is following her eating plan approximately 50 % of the time. She states she is exercising walking/running 30-60 minutes 7 times per week.   Interval History:  Since last office visit she is down 8 lbs. Bio impedence scale reviewed with the patient:  Muscle mass + 1.6 lbs Adipose mass - 9.8 lbs !  Down a total of 22 lb since 10/14/2022 TBW loss 8.6% since 10/14/2022!   Despite extensive international travel and dietary challenges, the patient has managed to lose weight and build muscle mass. The patient has a history of vitamin D deficiency and is currently on a 10,000 IU supplement. The patient has recently started a new job/promotion which involves a lot of travel. Congrats on promotion!    Pharmacotherapy: Wegovy 1 mg weekly. Prescribed by PCP. Denies mass in neck, dysphagia, dyspepsia, persistent hoarseness, abdominal pain, or N/V/Constipation or diarrhea. Has annual eye exam. Mood is stable.  Some constipation while traveling, improved now as increased fiber/water intake once back at home.   TREATMENT PLAN FOR OBESITY:  Recommended  Dietary Goals  Dawn Singh is currently in the action stage of change. As such, her goal is to continue weight management plan. She has agreed to the Category 2 Plan.  Behavioral Intervention  We discussed the following Behavioral Modification Strategies today: increasing lean protein intake to established goals, decreasing simple carbohydrates , increasing vegetables, increasing lower glycemic fruits, increasing fiber rich foods, avoiding skipping meals, increasing water intake , decreasing eating out or consumption of processed foods, and making healthy choices when eating convenient foods, practice mindfulness eating and understand the difference between hunger signals and cravings, work on managing stress, creating time for self-care and relaxation, and continue to work on maintaining a reduced calorie state, getting the recommended amount of protein, incorporating whole foods, making healthy choices, staying well hydrated and practicing mindfulness when eating..  Additional resources provided today: NA  Recommended Physical Activity Goals  Dawn Singh has been advised to work up to 150 minutes of moderate intensity aerobic activity a week and strengthening exercises 2-3 times per week for cardiovascular health, weight loss maintenance and preservation of muscle mass.   She has agreed to Continue current level of physical activity    Pharmacotherapy We discussed various medication options to help Dawn Singh with her weight loss efforts and we both agreed to continue Wegovy 1 mg weekly.    Return in about 4 weeks (around 02/08/2023).Marland Kitchen She was informed of the importance of frequent follow up visits to maximize her success with intensive lifestyle modifications for her multiple health conditions.  PHYSICAL EXAM:  Blood pressure 105/68, pulse 91, temperature 98.1 F (36.7 C), height 5\' 6"  (1.676 m), weight 234 lb (106.1 kg), SpO2 98%. Body mass index is 37.77 kg/m.  General: She is overweight,  cooperative, alert, well developed, and in no acute distress. PSYCH: Has normal mood, affect and thought process.   Cardiovascular: HR 90's BP 105/68 Lungs: Normal breathing effort, no conversational dyspnea. Neuro: no focal deficits  DIAGNOSTIC DATA REVIEWED:  BMET    Component Value Date/Time   NA 141 10/14/2022 1354   K 4.5 10/14/2022 1354   CL 100 10/14/2022 1354   CO2 20 10/14/2022 1354   GLUCOSE 67 (L) 10/14/2022 1354   GLUCOSE 107 (H) 02/08/2020 0832   BUN 17 10/14/2022 1354   CREATININE 0.72 10/14/2022 1354   CALCIUM 9.7 10/14/2022 1354   GFRNONAA >60 02/08/2020 0832   Lab Results  Component Value Date   HGBA1C 5.5 10/14/2022   Lab Results  Component Value Date   INSULIN 8.0 10/14/2022   Lab Results  Component Value Date   TSH 1.200 10/14/2022   CBC    Component Value Date/Time   WBC 7.9 10/14/2022 1354   WBC 6.6 02/08/2020 0832   RBC 5.21 10/14/2022 1354   RBC 4.72 02/08/2020 0832   HGB 15.0 10/14/2022 1354   HCT 45.4 10/14/2022 1354   PLT 284 10/14/2022 1354   MCV 87 10/14/2022 1354   MCH 28.8 10/14/2022 1354   MCH 29.9 02/08/2020 0832   MCHC 33.0 10/14/2022 1354   MCHC 33.6 02/08/2020 0832   RDW 13.3 10/14/2022 1354   Iron Studies No results found for: "IRON", "TIBC", "FERRITIN", "IRONPCTSAT" Lipid Panel     Component Value Date/Time   CHOL 196 10/14/2022 1354   TRIG 63 10/14/2022 1354   HDL 66 10/14/2022 1354   CHOLHDL 3.1 07/17/2022 0711   LDLCALC 118 (H) 10/14/2022 1354   Hepatic Function Panel     Component Value Date/Time   PROT 6.9 10/14/2022 1354   ALBUMIN 4.7 10/14/2022 1354   AST 18 10/14/2022 1354   ALT 18 10/14/2022 1354   ALKPHOS 79 10/14/2022 1354   BILITOT 0.4 10/14/2022 1354      Component Value Date/Time   TSH 1.200 10/14/2022 1354   Nutritional Lab Results  Component Value Date   VD25OH 77.3 10/14/2022   VD25OH 41.8 07/17/2022    ASSOCIATED CONDITIONS ADDRESSED TODAY  ASSESSMENT AND PLAN  Problem List  Items Addressed This Visit     Vitamin D deficiency   Mixed hyperlipidemia   Primary hypertension - Primary   Other Visit Diagnoses     Obesity (HCC)- Starting BMI 10/14/22 41.34          Obesity Significant improvement with [facility] treatment. Despite travel and dietary challenges, patient has lost weight, built muscle mass, and reduced visceral adipose rating. Noted episodes of nausea tied to not eating and discussed importance of regular intake of protein, avoiding fatty foods and adequate hydration.  -Continue Wegovy 1 mg weekly as prescribed by PCP. -Encourage consistent eating to manage nausea.    Hypertension Hypertension improved and reports occasionally feeling light headed when getting up .  Medication(s): Losartan 50 mg daily.   Renal function is normal.   BP Readings from Last 3 Encounters:  01/11/23 105/68  11/18/22 116/86  11/16/22 118/73   Lab Results  Component Value Date   CREATININE 0.72 10/14/2022   CREATININE 0.75 07/17/2022   CREATININE 0.75 02/08/2020   No results found for: "  GFR"  Plan: Continue losartan but would decrease to 25 mg daily over the next few weeks and monitor BP closely over the next 4 weeks.  Continue to work on nutrition plan to promote weight loss and improve BP control.    Hyperlipidemia LDL is not at goal. Medication(s): Pravastatin 10 mg daily. No side effects.  Cardiovascular risk factors: dyslipidemia, hypertension, and obesity (BMI >= 30 kg/m2)  Lab Results  Component Value Date   CHOL 196 10/14/2022   HDL 66 10/14/2022   LDLCALC 118 (H) 10/14/2022   TRIG 63 10/14/2022   CHOLHDL 3.1 07/17/2022   Lab Results  Component Value Date   ALT 18 10/14/2022   AST 18 10/14/2022   ALKPHOS 79 10/14/2022   BILITOT 0.4 10/14/2022   The 10-year ASCVD risk score (Arnett DK, et al., 2019) is: 1.1%   Values used to calculate the score:     Age: 55 years     Sex: Female     Is Non-Hispanic African American: No     Diabetic:  No     Tobacco smoker: No     Systolic Blood Pressure: 105 mmHg     Is BP treated: Yes     HDL Cholesterol: 66 mg/dL     Total Cholesterol: 196 mg/dL  Plan: Continue statin. Continue to work on nutrition plan -decreasing simple carbohydrates, increasing lean proteins, decreasing saturated fats and cholesterol , avoiding trans fats and exercise as able to promote weight loss, improve lipids and decrease cardiovascular risks. Continue Wegovy for CV risk reduction.    Vitamin D Deficiency Vitamin D is at goal of 50.  Most recent vitamin D level was 77.3. She is on OTC vitamin D 10,000 units daily. . Lab Results  Component Value Date   VD25OH 77.3 10/14/2022   VD25OH 41.8 07/17/2022    Plan: Continue OTC vitamin D 10,000 units daily.  Has follow up with PCP scheduled with labs for December.  May be able to decrease vitamin D 10,000 units 3 times weekly, but will defer to PCP.  Low vitamin D levels can be associated with adiposity and may result in leptin resistance and weight gain. Also associated with fatigue. Currently on vitamin D supplementation without any adverse effects.     ATTESTASTION STATEMENTS:  Reviewed by clinician on day of visit: allergies, medications, problem list, medical history, surgical history, family history, social history, and previous encounter notes.   I have personally spent 38 minutes total time today in preparation, patient care, nutritional counseling and documentation for this visit, including the following: review of clinical lab tests; review of medical tests/procedures/services.      Ethan Clayburn, PA-C

## 2023-01-11 ENCOUNTER — Encounter (INDEPENDENT_AMBULATORY_CARE_PROVIDER_SITE_OTHER): Payer: Self-pay | Admitting: Physician Assistant

## 2023-01-11 ENCOUNTER — Ambulatory Visit (INDEPENDENT_AMBULATORY_CARE_PROVIDER_SITE_OTHER): Payer: Managed Care, Other (non HMO) | Admitting: Physician Assistant

## 2023-01-11 VITALS — BP 105/68 | HR 91 | Temp 98.1°F | Ht 66.0 in | Wt 234.0 lb

## 2023-01-11 DIAGNOSIS — I10 Essential (primary) hypertension: Secondary | ICD-10-CM | POA: Diagnosis not present

## 2023-01-11 DIAGNOSIS — E559 Vitamin D deficiency, unspecified: Secondary | ICD-10-CM

## 2023-01-11 DIAGNOSIS — E782 Mixed hyperlipidemia: Secondary | ICD-10-CM

## 2023-01-11 DIAGNOSIS — Z6837 Body mass index (BMI) 37.0-37.9, adult: Secondary | ICD-10-CM

## 2023-01-11 DIAGNOSIS — E669 Obesity, unspecified: Secondary | ICD-10-CM

## 2023-02-03 ENCOUNTER — Encounter: Payer: Self-pay | Admitting: Family

## 2023-02-03 DIAGNOSIS — E66813 Obesity, class 3: Secondary | ICD-10-CM

## 2023-02-03 DIAGNOSIS — Z9189 Other specified personal risk factors, not elsewhere classified: Secondary | ICD-10-CM

## 2023-02-04 MED ORDER — WEGOVY 1 MG/0.5ML ~~LOC~~ SOAJ
1.0000 mg | SUBCUTANEOUS | 1 refills | Status: DC
Start: 2023-02-04 — End: 2023-05-24

## 2023-02-16 ENCOUNTER — Ambulatory Visit (INDEPENDENT_AMBULATORY_CARE_PROVIDER_SITE_OTHER): Payer: Managed Care, Other (non HMO) | Admitting: Physician Assistant

## 2023-02-16 ENCOUNTER — Encounter (INDEPENDENT_AMBULATORY_CARE_PROVIDER_SITE_OTHER): Payer: Self-pay | Admitting: Physician Assistant

## 2023-02-16 VITALS — BP 125/73 | HR 78 | Temp 97.9°F | Ht 66.0 in | Wt 231.0 lb

## 2023-02-16 DIAGNOSIS — E669 Obesity, unspecified: Secondary | ICD-10-CM | POA: Diagnosis not present

## 2023-02-16 DIAGNOSIS — Z6837 Body mass index (BMI) 37.0-37.9, adult: Secondary | ICD-10-CM | POA: Diagnosis not present

## 2023-02-16 DIAGNOSIS — I10 Essential (primary) hypertension: Secondary | ICD-10-CM

## 2023-02-16 DIAGNOSIS — L920 Granuloma annulare: Secondary | ICD-10-CM | POA: Diagnosis not present

## 2023-02-16 NOTE — Progress Notes (Signed)
SUBJECTIVE:  Chief Complaint: Obesity  Interim History: The patient, with a history of obesity, hypertension, mixed lipidemia, hyperlipidemia, vitamin D deficiency, colitis, and granuloma annulare, presents for a follow-up of her obesity treatment plan. She reports an increase in hunger despite being on Wegovy. She has not experienced any adverse effects such as diarrhea, constipation, nausea, or vomiting from the medication. However, she did experience some nausea during a recent travel, which she attributes to dehydration.  The patient's colitis is currently under control as long as she avoids certain foods, particularly complex carbohydrates like rice and bread. She also reports that her granuloma annulare has been causing discomfort, particularly along her underwear line due to friction during workouts.  The patient is very active, with a regular workout routine that includes running. However, she has noticed a loss of muscle mass and plans to incorporate strength training into her routine. She also has extensive travel plans, which can disrupt her sleep and meal planning. Despite these challenges, she has managed to lose 25 pounds since July.  Bernina is here to discuss her progress with her obesity treatment plan. She is on the Category 2 Plan and states she is following her eating plan approximately 70 % of the time. She states she is exercising walk/run combo 4 miles minutes 7 times per week.   OBJECTIVE: Visit Diagnoses: Problem List Items Addressed This Visit     GA (granuloma annulare)   Primary hypertension - Primary   Obesity (HCC)- Starting BMI 10/14/22 41.34   BMI 37.0-37.9, adult Current BMI 37.3  Obesity Noted increased hunger and recent weight loss of 25 pounds since July, but also slight loss of muscle mass.  Currently on Wegovy 1mg  without any significant side effects. Denies mass in neck, dysphagia, dyspepsia, persistent hoarseness, abdominal pain, or N/V/Constipation  or diarrhea. Has annual eye exam. Mood is stable.  Discussed potential need to increase dose and she will discuss with her PCP.  Also discussed the importance of strength training to prevent further muscle mass loss. -Continue Wegovy 1mg . -Consider increasing Wegovy dose after discussing with primary care provider. -Implement strength training exercises, focusing on core strengthening. Total weight loss of 25 lbs TBW loss of 9.38%  Hypertension Hypertension well controlled, improved, asymptomatic, and no significant medication side effects noted.  Medication(s): losartan 50 mg daily Renal function is normal. No side effects. No hypotension symptoms.   BP Readings from Last 3 Encounters:  02/16/23 125/73  01/11/23 105/68  11/18/22 116/86   Lab Results  Component Value Date   CREATININE 0.72 10/14/2022   CREATININE 0.75 07/17/2022   CREATININE 0.75 02/08/2020   No results found for: "GFR"  Plan: Continue all antihypertensives at current dosages. Continue to work on nutrition plan to promote weight loss and improve BP control.  May need to decrease losartan with continued weight loss . Monitor for hypotension. Maintain good hydration status. At least 80 -100 oz of fluids daily.    Granuloma annulare Noted worsening symptoms along underwear line, causing discomfort especially during workouts. Has a dermatology appointment scheduled next week. -Continue current management and follow up with dermatologist.  General Health Maintenance / Followup Plans -Plan fasting labs at next visit if not done by primary care provider. -Next appointment scheduled for April 15, 2023.  Vitals Temp: 97.9 F (36.6 C) BP: 125/73 Pulse Rate: 78 SpO2: 97 %   Anthropometric Measurements Height: 5\' 6"  (1.676 m) Weight: 231 lb (104.8 kg) BMI (Calculated): 37.3 Weight at Last Visit: 234 lb  Weight Lost Since Last Visit: 3 lb Weight Gained Since Last Visit: 0 Starting Weight: 256 lb Peak  Weight: 278 lb   Body Composition  Body Fat %: 44.2 % Fat Mass (lbs): 102.2 lbs Muscle Mass (lbs): 122.4 lbs Total Body Water (lbs): 90.6 lbs Visceral Fat Rating : 12   Other Clinical Data Fasting: yes Labs: no Today's Visit #: 5 Starting Date: 10/14/22     ASSESSMENT AND PLAN:  Diet: Keley is currently in the action stage of change. As such, her goal is to continue with weight loss efforts. She has agreed to Category 2 Plan.  Exercise: Chadsity has been instructed to continue exercising as is and add some strengthening into her daily routines at least 10 -12 minutes 3 times weekly  for weight loss and overall health benefits.   Behavior Modification:  We discussed the following Behavioral Modification Strategies today: increasing lean protein intake, decreasing simple carbohydrates, increasing vegetables, increase H2O intake, increase high fiber foods, no skipping meals, meal planning and cooking strategies, travel eating strategies, and holiday eating strategies. We discussed various medication options to help Adiela with her weight loss efforts and we both agreed to Continue Wegovy 1 mg weekly for medical weight loss. She has an appointment upcoming with her PCP and will discuss possibly increasing Wegovy at that time due to some increased appetite/hunger and cravings. She is tolerating the medication well and is only noticing GI distress with simple carbohydrates, mainly rice. She will be traveling extensively over the next couple of months and we discussed following up in early Jan. 2025 with fasting labs at that time if not obtained at PCP visit over the next couple of months.   Return in about 8 weeks (around 04/13/2023) for Fasting Lab.Marland Kitchen She was informed of the importance of frequent follow up visits to maximize her success with intensive lifestyle modifications for her multiple health conditions.  Attestation Statements:   Reviewed by clinician on day of visit:  allergies, medications, problem list, medical history, surgical history, family history, social history, and previous encounter notes.   Time spent on visit including pre-visit chart review and post-visit care and charting was 33 minutes.    Jammal Sarr, PA-C

## 2023-04-15 ENCOUNTER — Ambulatory Visit (INDEPENDENT_AMBULATORY_CARE_PROVIDER_SITE_OTHER): Payer: Managed Care, Other (non HMO) | Admitting: Physician Assistant

## 2023-04-30 ENCOUNTER — Encounter: Payer: Self-pay | Admitting: Family

## 2023-05-03 ENCOUNTER — Other Ambulatory Visit (HOSPITAL_COMMUNITY): Payer: Self-pay

## 2023-05-04 ENCOUNTER — Encounter: Payer: Self-pay | Admitting: Family

## 2023-05-04 ENCOUNTER — Other Ambulatory Visit: Payer: Self-pay | Admitting: Family

## 2023-05-04 ENCOUNTER — Other Ambulatory Visit (HOSPITAL_COMMUNITY): Payer: Self-pay

## 2023-05-04 ENCOUNTER — Telehealth: Payer: Self-pay

## 2023-05-04 DIAGNOSIS — Z9189 Other specified personal risk factors, not elsewhere classified: Secondary | ICD-10-CM

## 2023-05-04 DIAGNOSIS — E66813 Obesity, class 3: Secondary | ICD-10-CM

## 2023-05-04 NOTE — Telephone Encounter (Signed)
Pharmacy Patient Advocate Encounter   Received notification from Patient Advice Request messages that prior authorization for [facility] 1MG /0.5ML auto-injectors is required/requested.   Insurance verification completed.   The patient is insured through [facility] .   Per test claim: PA required; PA submitted to above mentioned insurance via CoverMyMeds Key/confirmation #/EOC  [facility] Status is pending

## 2023-05-04 NOTE — Telephone Encounter (Signed)
Pharmacy Patient Advocate Encounter  Received notification from [facility] that Prior Authorization for [facility] 1MG /0.5ML auto-injectors  has been DENIED.  See denial reason below. No denial letter attached in CMM. Will attach denial letter to Media tab once received.   PA #/Case ID/Reference #:  [id number]   DENIAL REASON:

## 2023-05-05 ENCOUNTER — Ambulatory Visit (INDEPENDENT_AMBULATORY_CARE_PROVIDER_SITE_OTHER): Payer: Managed Care, Other (non HMO) | Admitting: Physician Assistant

## 2023-05-05 ENCOUNTER — Encounter (INDEPENDENT_AMBULATORY_CARE_PROVIDER_SITE_OTHER): Payer: Self-pay | Admitting: Physician Assistant

## 2023-05-05 VITALS — BP 100/69 | HR 73 | Temp 98.4°F | Ht 66.0 in | Wt 222.0 lb

## 2023-05-05 DIAGNOSIS — K580 Irritable bowel syndrome with diarrhea: Secondary | ICD-10-CM | POA: Diagnosis not present

## 2023-05-05 DIAGNOSIS — E669 Obesity, unspecified: Secondary | ICD-10-CM

## 2023-05-05 DIAGNOSIS — Z6835 Body mass index (BMI) 35.0-35.9, adult: Secondary | ICD-10-CM

## 2023-05-05 DIAGNOSIS — I10 Essential (primary) hypertension: Secondary | ICD-10-CM

## 2023-05-05 DIAGNOSIS — E782 Mixed hyperlipidemia: Secondary | ICD-10-CM | POA: Diagnosis not present

## 2023-05-05 NOTE — Progress Notes (Signed)
SUBJECTIVE: Discussed the use of AI scribe software for clinical note transcription with the patient, who gave verbal consent to proceed. Chief Complaint: Obesity  Interim History: She is down 9 lbs since last visit.  Bio impedence scale reviewed with the patient:  Muscle mass - 1.4 lbs Adipose mass -7 lbs  Total body water - 3.8 lbs Down total of 34 lbs since 10/14/22 TBW loss of 13.3%  Dawn Singh is a 53 year old female with obesity who presents for follow-up of her obesity treatment plan.  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  She has lost a total of 34 pounds since starting the medication, with 29 pounds lof adipose lost overall. However, she is experiencing issues with insurance coverage for [facility], which is currently being denied. She is exploring alternative options.  She has been experiencing a slight return of gastrointestinal symptoms and increased hunger, particularly as she has been running more. These symptoms are not as severe as before starting Wegovy. She has been on the current dose since August 2024 and notes that its effectiveness is waning. No cramping or bleeding related to her gastrointestinal issues.  She has a history of hypertension and is currently on 50 mg of Cozaar. Her blood pressure has improved significantly, with a recent reading of 100/69 mmHg.  She is also on pravastatin for hypercholesterolemia and reports no side effects from this medication.  She travels frequently for work and has a busy schedule. She hopes to have less international travel for work overall in the next year. She will continue to travel thru out the [place].  Dawn Singh is here to discuss her progress with her obesity treatment plan. She is on the Category 2 Plan and states she is following her eating plan approximately 80-90 % of  the time. She states she is exercising walking/running 4 miles 7 times per week.  Has bi-annual follow up with her PCP 05/24/23 with labs.  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 Insurance denying ZOXWRU. Discussed Zepbound, a GLP/GIP medication with potential for greater weight loss, supported by clinical studies. Discussed risks of compounded medications. Considering Weight Watchers per insurance requirements if needed. - Check with HR regarding Weight Watchers requirement - Consider switching to Zepbound if insurance issues persist and discussed [employer] coupon program thru 09/2023 - Order Zepbound if needed - Follow up in one month  Chronic Gastrointestinal Issues/Runners colitis Chronic gastrointestinal issues, including frequent diarrhea when exercising has been significantly improved with Wegovy. Slight return of GI symptoms with increased running lately and some increased hunger. Discussed alternatives if unable to continue [facility] - Consider alternative medications if Reginal Lutes is discontinued for insurance reasons, but would favor continuing given the significant improvements in medical status and as well as functional ADL improvements with ability to be more active overall and have a more normal lifestyle.  - Monitor GI symptoms and adjust treatment as needed  Hypertension Hypertension  managed with 50 mg Cozaar. Current BP 100/69, indicating potential need for dose adjustment. Weight loss and medication may be contributing to improved BP. No symptoms of hypotension, but consistently lower BP with  weight loss. Renal function is normal.  - Discuss potential dose reduction of Cozaar with PCP who she is seeing in the next couple of weeks.  Continue to work on nutrition plan to promote weight loss and improve BP control.    Hypercholesterolemia Hypercholesterolemia managed with pravastatin.  Reginal Lutes should also provide significant CV risk reduction.  No reported side effects. - Continue pravastatin and Wegovy as prescribed Continue to work on nutrition plan -decreasing simple carbohydrates, increasing lean proteins, decreasing saturated fats and cholesterol , avoiding trans fats and exercise as able to promote weight loss, improve lipids and decrease cardiovascular risks.  General Health Maintenance Maintaining an active lifestyle with running and recent travel. Discussed importance of continuing physical activity to maintain muscle mass and overall health. - Continue running and physical activity  Follow-up - Follow up on March 3rd at 8 AM - Contact provider via MyChart if any issues arise before follow-up. Vitals Temp: 98.4 F (36.9 C) BP: 100/69 Pulse Rate: 73 SpO2: 99 %   Anthropometric Measurements Height: 5\' 6"  (1.676 m) Weight: 222 lb (100.7 kg) BMI (Calculated): 35.85 Weight at Last Visit: 231 lb Weight Lost Since Last Visit: 9 lb Weight Gained Since Last Visit: 0 Total Weight Loss (lbs): 34 lb (15.4 kg) Peak Weight: 260 lb   Body Composition  Body Fat %: 42.8 % Fat Mass (lbs): 95.2 lbs Muscle Mass (lbs): 121 lbs Total Body Water (lbs): 86.8 lbs Visceral Fat Rating : 12   Other Clinical Data Fasting: yes Labs: no Today's Visit #: 6     ASSESSMENT AND PLAN:  Diet: Dawn Singh is currently in the action stage of change. As such, her goal is to continue with weight loss efforts. She has agreed to Category 2 Plan.  Exercise: Dawn Singh has been instructed to continue exercising as is for weight loss and overall health benefits.   Behavior Modification:   We discussed the following Behavioral Modification Strategies today: increasing lean protein intake, decreasing simple carbohydrates, increasing vegetables, increase H2O intake, no skipping meals, meal planning and cooking strategies, avoiding temptations, and planning for success. We discussed various medication options to help Dawn Singh with her weight loss efforts and we both agreed to continue [facility] for prediabetes, hyperlipidemia, runner's colitis /exercise induced GI distress, in addition to medical weight loss. She is going to check into other options if cannot be approved for [facility].   Return in about 5 weeks (around 06/09/2023).Marland Kitchen She was informed of the importance of frequent follow up visits to maximize her success with intensive lifestyle modifications for her multiple health conditions.  Attestation Statements:   Reviewed by clinician on day of visit: allergies, medications, problem list, medical history, surgical history, family history, social history, and previous encounter notes.   Time spent on visit including pre-visit chart review and post-visit care and charting was 40 minutes.    Traniya Prichett, PA-C

## 2023-05-07 NOTE — Telephone Encounter (Signed)
PA request has been Denied. New Encounter created for follow up. For additional info see Pharmacy Prior Auth telephone encounter from 01/28.

## 2023-05-10 ENCOUNTER — Encounter: Payer: Self-pay | Admitting: Family

## 2023-05-17 ENCOUNTER — Ambulatory Visit: Payer: Managed Care, Other (non HMO) | Admitting: Family

## 2023-05-24 ENCOUNTER — Encounter: Payer: Self-pay | Admitting: Family

## 2023-05-24 ENCOUNTER — Ambulatory Visit: Payer: Managed Care, Other (non HMO) | Admitting: Family

## 2023-05-24 VITALS — BP 112/74 | HR 73 | Temp 98.3°F | Ht 66.0 in | Wt 218.0 lb

## 2023-05-24 DIAGNOSIS — E559 Vitamin D deficiency, unspecified: Secondary | ICD-10-CM

## 2023-05-24 DIAGNOSIS — I10 Essential (primary) hypertension: Secondary | ICD-10-CM

## 2023-05-24 DIAGNOSIS — E661 Drug-induced obesity: Secondary | ICD-10-CM

## 2023-05-24 DIAGNOSIS — E782 Mixed hyperlipidemia: Secondary | ICD-10-CM | POA: Diagnosis not present

## 2023-05-24 DIAGNOSIS — E538 Deficiency of other specified B group vitamins: Secondary | ICD-10-CM | POA: Insufficient documentation

## 2023-05-24 DIAGNOSIS — E66812 Obesity, class 2: Secondary | ICD-10-CM | POA: Insufficient documentation

## 2023-05-24 DIAGNOSIS — Z6835 Body mass index (BMI) 35.0-35.9, adult: Secondary | ICD-10-CM

## 2023-05-24 NOTE — Assessment & Plan Note (Signed)
Continue pravastatin  Ordered lipid panel, pending results. Work on low cholesterol diet and exercise as tolerated

## 2023-05-24 NOTE — Addendum Note (Signed)
Addended by: Vincenza Hews on: 05/24/2023 08:36 AM   Modules accepted: Orders

## 2023-05-24 NOTE — Progress Notes (Signed)
Established Patient Office Visit  Subjective:      CC:  Chief Complaint  Patient presents with   Medical Management of Chronic Issues    HPI: Dawn Singh is a 53 y.o. female presenting on 05/24/2023 for Medical Management of Chronic Issues  Obesity: following with healthy weight and wellness.  On wegovy however she is on a three week delay, her insurance was requiring her to establish with weight watchers, which she has done. Reginal Lutes has been so beneficial for her especially also for IBS symptoms. She was nervous because they wanted to send her in a compound which makes her uncomfortable. She has had a total weight loss of   Wt Readings from Last 3 Encounters:  05/24/23 218 lb (98.9 kg)  05/05/23 222 lb (100.7 kg)  02/16/23 231 lb (104.8 kg)           Social history:  Relevant past medical, surgical, family and social history reviewed and updated as indicated. Interim medical history since our last visit reviewed.  Allergies and medications reviewed and updated.  DATA REVIEWED: CHART IN EPIC     ROS: Negative unless specifically indicated above in HPI.    Current Outpatient Medications:    Cholecalciferol (VITAMIN D-3) 125 MCG (5000 UT) TABS, Take by mouth., Disp: , Rfl:    b complex vitamins capsule, Take 2 capsules by mouth daily., Disp: , Rfl:    ibuprofen (ADVIL) 200 MG tablet, Take 400 mg by mouth every 6 (six) hours as needed for headache or moderate pain., Disp: , Rfl:    losartan (COZAAR) 50 MG tablet, Take 1 tablet (50 mg total) by mouth daily., Disp: 90 tablet, Rfl: 3   Melatonin 1 MG CAPS, Take 1 mg by mouth at bedtime. PRN when traveling, Disp: , Rfl:    pravastatin (PRAVACHOL) 10 MG tablet, Take 1 tablet (10 mg total) by mouth daily., Disp: 90 tablet, Rfl: 3      Objective:    BP 112/74 (BP Location: Right Arm, Patient Position: Sitting, Cuff Size: Normal)   Pulse 73   Temp 98.3 F (36.8 C) (Temporal)   Ht 5\' 6"  (1.676 m)   Wt 218 lb  (98.9 kg)   SpO2 98%   BMI 35.19 kg/m   Wt Readings from Last 3 Encounters:  05/24/23 218 lb (98.9 kg)  05/05/23 222 lb (100.7 kg)  02/16/23 231 lb (104.8 kg)    Physical Exam Constitutional:      General: She is not in acute distress.    Appearance: Normal appearance. She is obese. She is not ill-appearing, toxic-appearing or diaphoretic.  HENT:     Head: Normocephalic.  Cardiovascular:     Rate and Rhythm: Normal rate.  Pulmonary:     Effort: Pulmonary effort is normal.  Musculoskeletal:        General: Normal range of motion.  Neurological:     General: No focal deficit present.     Mental Status: She is alert and oriented to person, place, and time. Mental status is at baseline.  Psychiatric:        Mood and Affect: Mood normal.        Behavior: Behavior normal.        Thought Content: Thought content normal.        Judgment: Judgment normal.           Assessment & Plan:  Mixed hyperlipidemia Assessment & Plan: Continue pravastatin  Ordered lipid panel, pending results. 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  [name], MSN, APRN, FNP-C [facility]

## 2023-05-24 NOTE — Assessment & Plan Note (Signed)
 Stable. Continue losartan

## 2023-05-24 NOTE — Assessment & Plan Note (Signed)
 Pt advised to work on diet and exercise as tolerated

## 2023-05-27 ENCOUNTER — Encounter: Payer: Self-pay | Admitting: Family

## 2023-05-27 LAB — COMPREHENSIVE METABOLIC PANEL
ALT: 21 [IU]/L (ref 0–32)
AST: 20 [IU]/L (ref 0–40)
Albumin: 4.1 g/dL (ref 3.8–4.9)
Alkaline Phosphatase: 62 [IU]/L (ref 44–121)
BUN/Creatinine Ratio: 18 (ref 9–23)
BUN: 15 mg/dL (ref 6–24)
Bilirubin Total: 0.4 mg/dL (ref 0.0–1.2)
CO2: 25 mmol/L (ref 20–29)
Calcium: 9.3 mg/dL (ref 8.7–10.2)
Chloride: 102 mmol/L (ref 96–106)
Creatinine, Ser: 0.84 mg/dL (ref 0.57–1.00)
Globulin, Total: 2.3 g/dL (ref 1.5–4.5)
Glucose: 79 mg/dL (ref 70–99)
Potassium: 4.3 mmol/L (ref 3.5–5.2)
Sodium: 140 mmol/L (ref 134–144)
Total Protein: 6.4 g/dL (ref 6.0–8.5)
eGFR: 83 mL/min/{1.73_m2} (ref >59)

## 2023-05-27 LAB — CBC
Hematocrit: 42.3 % (ref 34.0–46.6)
Hemoglobin: 13.5 g/dL (ref 11.1–15.9)
MCH: 28.8 pg (ref 26.6–33.0)
MCHC: 31.9 g/dL (ref 31.5–35.7)
MCV: 90 fL (ref 79–97)
Platelets: 248 10*3/uL (ref 150–450)
RBC: 4.68 x10E6/uL (ref 3.77–5.28)
RDW: 13 % (ref 11.7–15.4)
WBC: 5 10*3/uL (ref 3.4–10.8)

## 2023-05-27 LAB — MICROALBUMIN / CREATININE URINE RATIO
Creatinine, Urine: 207.8 mg/dL
Microalb/Creat Ratio: 4 mg/g{creat} (ref 0–29)
Microalbumin, Urine: 9.1 ug/mL

## 2023-05-27 LAB — HEMOGLOBIN A1C
Est. average glucose Bld gHb Est-mCnc: 100 mg/dL
Hgb A1c MFr Bld: 5.1 % (ref 4.8–5.6)

## 2023-05-27 LAB — LIPID PANEL
Chol/HDL Ratio: 2.9 {ratio} (ref 0.0–4.4)
Cholesterol, Total: 182 mg/dL (ref 100–199)
HDL: 62 mg/dL (ref >39)
LDL Chol Calc (NIH): 108 mg/dL — ABNORMAL HIGH (ref 0–99)
Triglycerides: 63 mg/dL (ref 0–149)
VLDL Cholesterol Cal: 12 mg/dL (ref 5–40)

## 2023-05-27 LAB — VITAMIN D 25 HYDROXY (VIT D DEFICIENCY, FRACTURES): Vit D, 25-Hydroxy: 65 ng/mL (ref 30.0–100.0)

## 2023-05-27 LAB — VITAMIN B12: Vitamin B-12: 869 pg/mL (ref 232–1245)

## 2023-05-27 LAB — TSH: TSH: 1.43 u[IU]/mL (ref 0.450–4.500)

## 2023-06-07 ENCOUNTER — Ambulatory Visit (INDEPENDENT_AMBULATORY_CARE_PROVIDER_SITE_OTHER): Payer: Managed Care, Other (non HMO) | Admitting: Physician Assistant

## 2023-06-07 ENCOUNTER — Encounter (INDEPENDENT_AMBULATORY_CARE_PROVIDER_SITE_OTHER): Payer: Self-pay | Admitting: Physician Assistant

## 2023-06-07 VITALS — BP 113/72 | HR 69 | Temp 98.1°F | Ht 66.0 in | Wt 222.0 lb

## 2023-06-07 DIAGNOSIS — E782 Mixed hyperlipidemia: Secondary | ICD-10-CM | POA: Diagnosis not present

## 2023-06-07 DIAGNOSIS — I10 Essential (primary) hypertension: Secondary | ICD-10-CM | POA: Diagnosis not present

## 2023-06-07 DIAGNOSIS — E538 Deficiency of other specified B group vitamins: Secondary | ICD-10-CM | POA: Diagnosis not present

## 2023-06-07 DIAGNOSIS — E669 Obesity, unspecified: Secondary | ICD-10-CM

## 2023-06-07 DIAGNOSIS — E559 Vitamin D deficiency, unspecified: Secondary | ICD-10-CM | POA: Diagnosis not present

## 2023-06-07 DIAGNOSIS — Z6836 Body mass index (BMI) 36.0-36.9, adult: Secondary | ICD-10-CM

## 2023-06-07 NOTE — Progress Notes (Addendum)
 SUBJECTIVE: Discussed the use of AI scribe software for clinical note transcription with the patient, who gave verbal consent to proceed.  Chief Complaint: Obesity  Interim History: She has maintained her weight loss since last visit despite being off [facility] for the past month.  Muscle mass - 0.6 lbs Adipose mass + 0.8 lbs Total body water - 0.2 lbs   Dawn Singh is here to discuss her progress with her obesity treatment plan. She is on the Category 2 Plan and states she is following her eating plan approximately 70 % of the time. She states she is exercising walking 15 minutes 5 times per week.  Dawn Singh is a 53 year old female with obesity who presents for follow-up of her obesity treatment plan.  She has been off [place] for over a month due to insurance issues, which has led to a plateau in her weight loss. She experiences increased hunger, including nocturnal hunger, which is a new symptom for her. Despite this, she has not gained weight, even with some indulgence during two birthdays, and has maintained her muscle mass. She struggles with adherence to her diet plan due to increased hunger and has been consuming snacks, which she previously avoided.  She experiences exercise-induced diarrhea( runners colitis) , which was previously well-controlled with ZOXWRU. She has had to stop running due to the return of IBS symptoms after discontinuing Wegovy.  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  She has a history of hypertension and hyperlipidemia. Recent lab results show improved cholesterol levels, with LDL lower than before. She prefers to continue weight loss efforts before considering medication changes due to a family history of statin-induced diabetes.  She has vitamin D and B12 deficiencies, which are well-managed with supplementation. Her vitamin D and B12 levels are within normal range.  Her current medications include vitamin D 5000 IU and B12 supplements.  She travels frequently for work, with upcoming domestic and international trips planned.  Saw PCP- Dawn Sawyers, FNP with labs 05/26/23   OBJECTIVE: Visit Diagnoses: Problem List Items Addressed This Visit     Vitamin D deficiency   Mixed hyperlipidemia   Primary hypertension - Primary   Vitamin B12 deficiency   Other Visit Diagnoses       Obesity (HCC)- Starting BMI 10/14/22 41.34         BMI 36.0-36.9,adult Current BMI 36.0          Obesity 53 year old female with obesity, previously on Wegovy with significant weight loss and improvement in exercise-induced IBS and granuloma annulare. Off Wegovy for over a month due to insurance issues, resulting in hunger and recurrent IBS symptoms, but maintained weight and muscle mass. Prefers Wegovy due to positive response and familiarity. - Restart Wegovy at a lower dose once approved by insurance She has just been notified that she will be covered for 90 days of Zepbound at 2.5 mg weekly.  Hopefully this will help with appetite as well as GI concerns.  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 Discussed restarting Wegovy to manage symptoms and encouraged weight training as an alternative exercise. She has been notified that she will be able to start on Zepbound at 2.5 mg weekly for a 90 day course.  Will monitor closely and hopefully will have  an opportunity to increase the dosage if indicated  - Encourage weight training as an alternative exercise until IBS symptoms are controlled  Hypertension Hypertension with overall health metrics reviewed. BP at goal. Renal function in normal range.  On losartan 50 mg daily without side effects.  Continue  losartan.  Continue to work on nutrition plan to promote weight loss and improve BP control.    Hyperlipidemia Cholesterol levels improved but LDL slightly elevated. Prefers to continue weight loss efforts before considering medication adjustments due to family history of statin-induced diabetes. Discussed potential risks of statins. Lab Results  Component Value Date   CHOL 182 05/26/2023   CHOL 196 10/14/2022   CHOL 226 (H) 07/17/2022   Lab Results  Component Value Date   HDL 62 05/26/2023   HDL 66 10/14/2022   HDL 73 07/17/2022   Lab Results  Component Value Date   LDLCALC 108 (H) 05/26/2023   LDLCALC 118 (H) 10/14/2022   LDLCALC 142 (H) 07/17/2022   Lab Results  Component Value Date   TRIG 63 05/26/2023   TRIG 63 10/14/2022   TRIG 65 07/17/2022   Lab Results  Component Value Date   CHOLHDL 2.9 05/26/2023   CHOLHDL 3.1 07/17/2022   No results found for: "LDLDIRECT" To start Zepbound as noted and likely to decrease CV risk.  - Continue current management and re-evaluate lipid levels after further weight loss Continue to work on nutrition plan -decreasing simple carbohydrates, increasing lean proteins, decreasing saturated fats and cholesterol , avoiding trans fats and exercise as able to promote weight loss, improve lipids and decrease cardiovascular risks.  Vitamin D Deficiency Vitamin D levels well-managed on 5000 IU daily supplementation. Last vitamin D Lab Results  Component Value Date   VD25OH 65.0 05/26/2023    Low vitamin D levels can be associated with adiposity and may result in leptin resistance and weight gain. Also associated with fatigue.  Currently on vitamin D supplementation without any adverse effects such as nausea, vomiting or muscle weakness.  Our goal is 50-70.  - Continue 5000 IU vitamin D supplementation - Reassess vitamin D levels in a few months  Vitamin B12 Deficiency B12 levels well-managed with current B complex  supplementation. Last B12 869- improved.  - Continue B complex supplementation - Monitor B12 levels at next lab check  General Health Maintenance Overall health metrics, including kidney function, liver function, and A1c, within normal ranges. Hemoglobin and hematocrit slightly lower but within normal limits. Colonoscopy in 2021 was normal. Discussed maintaining muscle mass and accurate health monitoring. - Monitor hemoglobin and hematocrit levels at next lab check - Continue routine health maintenance and screenings  Follow-up - Schedule follow-up appointment in one month on March 31st at 8 AM. Vitals Temp: 98.1 F (36.7 C) BP: 113/72 Pulse Rate: 69 SpO2: 99 %   Anthropometric Measurements Height: 5\' 6"  (1.676 m) Weight: 222 lb (100.7 kg) BMI (Calculated): 35.85 Weight at Last Visit: 222lb Weight Lost Since Last Visit: 0 Weight Gained Since Last Visit: 0 Starting Weight: 256lb Total Weight Loss (lbs): 34 lb (15.4 kg) Peak Weight: 260lb   Body Composition  Body Fat %: 43.1 % Fat Mass (lbs): 96 lbs Muscle Mass (lbs): 120.4 lbs Total Body Water (lbs): 86.6 lbs Visceral Fat Rating : 12   Other Clinical Data Fasting: yes Labs: no Today's Visit #: 7 Starting Date: 10/14/22     ASSESSMENT AND PLAN:  Diet: Labrenda is currently in the action stage of change. As such, her  goal is to continue with weight loss efforts and has agreed to the Category 2 Plan.   Exercise:  For substantial health benefits, adults should do at least 150 minutes (2 hours and 30 minutes) a week of moderate-intensity, or 75 minutes (1 hour and 15 minutes) a week of vigorous-intensity aerobic physical activity, or an equivalent combination of moderate- and vigorous-intensity aerobic activity. Aerobic activity should be performed in episodes of at least 10 minutes, and preferably, it should be spread throughout the week.  Behavior Modification:  We discussed the following Behavioral Modification  Strategies today: increasing lean protein intake, decreasing simple carbohydrates, increasing vegetables, increase H2O intake, travel eating strategies, avoiding temptations, and planning for success. We discussed various medication options to help Brailey with her weight loss efforts and we both agreed to continue to work on nutritional and behavioral strategies to promote weight loss and is to start Zepbound for  medical weight loss.  Return in about 4 weeks (around 07/05/2023).Marland Kitchen She was informed of the importance of frequent follow up visits to maximize her success with intensive lifestyle modifications for her multiple health conditions.  Attestation Statements:   Reviewed by clinician on day of visit: allergies, medications, problem list, medical history, surgical history, family history, social history, and previous encounter notes.   Time spent on visit including pre-visit chart review and post-visit care and charting was 36 minutes  Dawn Legaspi,PA-C

## 2023-06-09 ENCOUNTER — Encounter: Payer: Self-pay | Admitting: Family

## 2023-06-09 DIAGNOSIS — Z124 Encounter for screening for malignant neoplasm of cervix: Secondary | ICD-10-CM

## 2023-06-09 DIAGNOSIS — Z1231 Encounter for screening mammogram for malignant neoplasm of breast: Secondary | ICD-10-CM

## 2023-06-11 NOTE — Addendum Note (Signed)
 Addended by: [name] on: 06/11/2023 03:26 PM   Modules accepted: Orders

## 2023-06-21 ENCOUNTER — Encounter: Payer: Self-pay | Admitting: Obstetrics and Gynecology

## 2023-07-05 ENCOUNTER — Ambulatory Visit (INDEPENDENT_AMBULATORY_CARE_PROVIDER_SITE_OTHER): Admitting: Physician Assistant

## 2023-07-06 ENCOUNTER — Ambulatory Visit
Admission: RE | Admit: 2023-07-06 | Discharge: 2023-07-06 | Disposition: A | Discharge status: Home / Self Care | Source: Ambulatory Visit | Attending: Family | Admitting: Family

## 2023-07-06 DIAGNOSIS — Z1231 Encounter for screening mammogram for malignant neoplasm of breast: Secondary | ICD-10-CM | POA: Diagnosis present

## 2023-07-08 ENCOUNTER — Encounter: Payer: Self-pay | Admitting: Family

## 2023-08-03 NOTE — Progress Notes (Unsigned)
 SUBJECTIVE: Discussed the use of AI scribe software for clinical note transcription with the patient, who gave verbal consent to proceed.  Chief Complaint: Obesity  Interim History: She is down 5 lbs since last visit.  Down 39 lbs overall TBW loss of ~ 18 %  Maryfrances is here to discuss her progress with her obesity treatment plan. She is on the following a lower carbohydrate, vegetable and lean protein rich diet plan and states she is following her eating plan approximately 70 % of the time. She states she is exercising walking 4 miles 65-69 minutes 7 times per week.  Lanel Hurtt is a 53 year old female with obesity who presents for follow-up of her obesity treatment plan.  She has a history of hyperlipidemia, hypertension, and vitamin D  and B12 deficiencies. She was previously on Wegovy , which was effective, but due to insurance changes, she was switched to Zepbound after a gap of 7 weeks.   She adjusts the timing of her Zepbound injections to the morning to avoid sleep disturbances, which initially caused hot flashes and fatigue. Her hunger is well-controlled for the first four days after the injection, but less so by day five. [facility] is prescribing Zepbound and she suspects they will recommend an increase in dosage soon- to 7.5 mg weekly.  She is having no other side effects- Denies mass in neck, dysphagia, dyspepsia, persistent hoarseness, abdominal pain, or N/V/Constipation or diarrhea. Has annual eye exam. Mood is stable.   We discussed making sure she is well-hydrated, especially on weekends when she is more active, engaging in activities like hiking and long walks. Her body is adapting to the medication, and she feels better and more functional after the initial fatigue subsides. She has experienced a 0.6 increase in muscle mass and a 5.6 decrease in adipose tissue, with a reduction in visceral adipose rating to 11.  For her hypertension, she is on Cozaar  50 mg. She experiences  occasional lightheadedness, particularly when squatting down and standing up, but feels better after walking. Her blood pressure is well-controlled over the past couple of months.  Regarding her hyperlipidemia, LDL levels needing further reduction. She is trying to manage this through weight loss to avoid statin side effects. She is currently on Pravachol  to avoid interaction with grapefruit which she eats regularly.  Her family history includes a risk of diabetes and hereditary cholesterol issues. OBJECTIVE: Visit Diagnoses: Problem List Items Addressed This Visit     Mixed hyperlipidemia - Primary   Primary hypertension   Other Visit Diagnoses       Obesity (HCC)- Starting BMI 10/14/22 41.34         BMI 35.0-35.9,adult Current BMI 35.2         Obesity Currently on Zepbound 5 mg weekly for weight management after previously using Wegovy  . Reports controlled hunger for the first four days post-injection, with fatigue and hot flashes as side effects.  Muscle mass has increased, and adipose tissue has decreased. BMI is 35, with a goal to further reduce adipose percentage. Engages in physical activities such as hiking and walking. Discussed potential stabilization of side effects with dose adjustment to 7.5 or 10 mg of Zepbound. - Continue Zepbound with current dosing schedule - Ensure adequate hydration, especially during weekends and physical activities - Monitor for side effects and adjust dosing as needed  Hypertension Blood pressure is well-controlled on current medication regimen. Reports occasional lightheadedness when squatting and standing, but no significant issues. BP Readings from Last 3 Encounters:  08/04/23 103/70  06/07/23 113/72  05/24/23 112/74   Continue to work on nutrition plan to promote weight loss and improve BP control.  - Continue Cozaar  50 mg daily - Monitor for any further episodes of lightheadedness and discuss with PCP.  Maintain good hydration status- at  least 80-100 oz of fluids daily.   Hyperlipidemia Cholesterol levels are slightly elevated with LDL above target. HDL is high, providing cardioprotection. Triglycerides remain stable and in target range. On Pravachol  10 mg daily. Reports no side effects.  Prefers managing cholesterol through weight loss rather than other statins due to concerns about side effects and dietary preferences- eats grapefruit regularly. Family history of hereditary cholesterol issues and strokes. Lab Results  Component Value Date   CHOL 182 05/26/2023   CHOL 196 10/14/2022   CHOL 226 (H) 07/17/2022   Lab Results  Component Value Date   HDL 62 05/26/2023   HDL 66 10/14/2022   HDL 73 07/17/2022   Lab Results  Component Value Date   LDLCALC 108 (H) 05/26/2023   LDLCALC 118 (H) 10/14/2022   LDLCALC 142 (H) 07/17/2022   Lab Results  Component Value Date   TRIG 63 05/26/2023   TRIG 63 10/14/2022   TRIG 65 07/17/2022   Lab Results  Component Value Date   CHOLHDL 2.9 05/26/2023   CHOLHDL 3.1 07/17/2022   No results found for: "LDLDIRECT" Continue pravachol .  - Continue current dietary and weight management efforts - Recheck cholesterol levels in a few months Continue to work on nutrition plan -decreasing simple carbohydrates, increasing lean proteins, decreasing saturated fats and cholesterol , avoiding trans fats and exercise as able to promote weight loss, improve lipids and decrease cardiovascular risks.   Vitals Temp: 97.8 F (36.6 C) BP: 103/70 Pulse Rate: 80 SpO2: 98 %   Anthropometric Measurements Height: 5\' 6"  (1.676 m) Weight: 217 lb (98.4 kg) BMI (Calculated): 35.04 Weight at Last Visit: 222 lb Weight Lost Since Last Visit: 5 lb Weight Gained Since Last Visit: 0 Starting Weight: 217 lb Total Weight Loss (lbs): 39 lb (17.7 kg) Peak Weight: 260 lb   Body Composition  Body Fat %: 41.5 % Fat Mass (lbs): 90.4 lbs Muscle Mass (lbs): 121 lbs Total Body Water (lbs): 85.8  lbs Visceral Fat Rating : 11   Other Clinical Data Labs: no Today's Visit #: 8 Starting Date: 10/14/22     ASSESSMENT AND PLAN:  Diet: Marshell is currently in the action stage of change. As such, her goal is to continue with weight loss efforts. She has agreed to following a lower carbohydrate, vegetable and lean protein rich diet plan.  Exercise: Terise has been instructed to continue exercising as is for weight loss and overall health benefits.   Behavior Modification:  We discussed the following Behavioral Modification Strategies today: increasing lean protein intake, decreasing simple carbohydrates, increasing vegetables, increase H2O intake, no skipping meals, avoiding temptations, planning for success, and keep a strict food journal. We discussed various medication options to help Ariyana with her weight loss efforts and we both agreed to continue current treatment plan with Zepbound through [facility] for medical weight loss and continue to work on nutritional and behavioral strategies to promote weight loss.  .  Return in about 6 weeks (around 09/15/2023).Aaron Aas She was informed of the importance of frequent follow up visits to maximize her success with intensive lifestyle modifications for her multiple health conditions.  Attestation Statements:   Reviewed by clinician on day of visit: allergies, medications, problem  list, medical history, surgical history, family history, social history, and previous encounter notes.   Time spent on visit including pre-visit chart review and post-visit care and charting was 32 minutes.    Taia Bramlett, PA-C

## 2023-08-04 ENCOUNTER — Encounter (INDEPENDENT_AMBULATORY_CARE_PROVIDER_SITE_OTHER): Payer: Self-pay | Admitting: Physician Assistant

## 2023-08-04 ENCOUNTER — Ambulatory Visit (INDEPENDENT_AMBULATORY_CARE_PROVIDER_SITE_OTHER): Admitting: Physician Assistant

## 2023-08-04 VITALS — BP 103/70 | HR 80 | Temp 97.8°F | Ht 66.0 in | Wt 217.0 lb

## 2023-08-04 DIAGNOSIS — E782 Mixed hyperlipidemia: Secondary | ICD-10-CM | POA: Diagnosis not present

## 2023-08-04 DIAGNOSIS — E669 Obesity, unspecified: Secondary | ICD-10-CM

## 2023-08-04 DIAGNOSIS — Z6835 Body mass index (BMI) 35.0-35.9, adult: Secondary | ICD-10-CM | POA: Diagnosis not present

## 2023-08-04 DIAGNOSIS — I10 Essential (primary) hypertension: Secondary | ICD-10-CM | POA: Diagnosis not present

## 2023-08-04 DIAGNOSIS — E559 Vitamin D deficiency, unspecified: Secondary | ICD-10-CM

## 2023-08-09 ENCOUNTER — Other Ambulatory Visit: Payer: Self-pay | Admitting: *Deleted

## 2023-08-09 DIAGNOSIS — E782 Mixed hyperlipidemia: Secondary | ICD-10-CM

## 2023-08-09 DIAGNOSIS — I10 Essential (primary) hypertension: Secondary | ICD-10-CM

## 2023-08-09 MED ORDER — PRAVASTATIN SODIUM 10 MG PO TABS: 10.0000 mg | ORAL_TABLET | Freq: Every day | ORAL | 3 refills | Status: DC

## 2023-08-09 MED ORDER — LOSARTAN POTASSIUM 50 MG PO TABS: 50.0000 mg | ORAL_TABLET | Freq: Every day | ORAL | 3 refills | Status: AC

## 2023-08-11 ENCOUNTER — Other Ambulatory Visit: Payer: Self-pay | Admitting: *Deleted

## 2023-08-11 DIAGNOSIS — E782 Mixed hyperlipidemia: Secondary | ICD-10-CM

## 2023-08-11 MED ORDER — PRAVASTATIN SODIUM 10 MG PO TABS: 10.0000 mg | ORAL_TABLET | Freq: Every day | ORAL | 3 refills | Status: AC

## 2023-10-11 ENCOUNTER — Ambulatory Visit (INDEPENDENT_AMBULATORY_CARE_PROVIDER_SITE_OTHER): Admitting: Physician Assistant

## 2023-12-01 ENCOUNTER — Encounter (INDEPENDENT_AMBULATORY_CARE_PROVIDER_SITE_OTHER): Payer: Self-pay | Admitting: Physician Assistant

## 2023-12-01 ENCOUNTER — Ambulatory Visit (INDEPENDENT_AMBULATORY_CARE_PROVIDER_SITE_OTHER): Admitting: Physician Assistant

## 2023-12-01 VITALS — BP 111/81 | HR 95 | Temp 98.6°F | Ht 66.0 in | Wt 214.0 lb

## 2023-12-01 DIAGNOSIS — E669 Obesity, unspecified: Secondary | ICD-10-CM

## 2023-12-01 DIAGNOSIS — E782 Mixed hyperlipidemia: Secondary | ICD-10-CM

## 2023-12-01 DIAGNOSIS — E559 Vitamin D deficiency, unspecified: Secondary | ICD-10-CM

## 2023-12-01 DIAGNOSIS — K580 Irritable bowel syndrome with diarrhea: Secondary | ICD-10-CM

## 2023-12-01 DIAGNOSIS — R5382 Chronic fatigue, unspecified: Secondary | ICD-10-CM

## 2023-12-01 DIAGNOSIS — E538 Deficiency of other specified B group vitamins: Secondary | ICD-10-CM

## 2023-12-01 DIAGNOSIS — I10 Essential (primary) hypertension: Secondary | ICD-10-CM

## 2023-12-01 DIAGNOSIS — Z6834 Body mass index (BMI) 34.0-34.9, adult: Secondary | ICD-10-CM

## 2023-12-01 NOTE — Progress Notes (Signed)
 SUBJECTIVE: Discussed the use of AI scribe software for clinical note transcription with the patient, who gave verbal consent to proceed.  Chief Complaint: Obesity  Interim History: She is down 3 lbs since her last visit Down 42 lbs overall TBW loss of 19.4%  Dawn Singh is here to discuss her progress with her obesity treatment plan. She is on the Category 1 Plan and states she is following her eating plan approximately 50 % of the time. She states she is walking/running for 30-60 minutes 7 times per week.  Dawn Singh is a 53 year old female who presents for follow-up of her obesity treatment program.  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   Despite extensive travel, she has maintained/increased her weight loss. She has had a slight decrease in muscle mass and experiences cravings, though not severe. She feels jittery at mealtime, which she did not experience with Wegovy , and finds it makes her more irritable towards hunger. She has been on GLP-1 medications for just over a year, but does feel she had better results and fewer side effects with Wegovy  than with current Zepbound.  Her medical history includes mixed hyperlipidemia, primary hypertension, vitamin D  deficiency, and runner's colitis/IBS.  She is on pravastatin  for hyperlipidemia, which had previously responded well, but recent labs showed an unusually high LDL level, which she suspects may be an error. Her triglycerides remain low and HDL is fine.   Her gastrointestinal symptoms have improved significantly with the weight loss medication. However, she experienced constipation during her travels to [place] due to a lack of fruits and vegetables in her diet, which was unusual for her. She typically does not have issues with  bowel movements since starting GLP-1 RA medication.  She has been incorporating more running into her routine to maintain her weight loss and improve her muscle mass. She is considering using a weighted vest to increase her exercise intensity on days she does not feel like running. She finds it challenging to maintain a consistent weight training routine due to boredom and scheduling conflicts.  She is concerned about her vitamin D  levels, as her primary care provider noted it was higher than desired, leading her to reduce her intake. Her son was found to have critically low vitamin D  levels, prompting her to be more vigilant about her own levels.  Plan for fasting labs at [facility] between appointments and provided orders for labs The patient was informed we would discuss the lab results at the next visit unless there is a critical issue that needs to be addressed sooner. The patient agreed to keep the next visit at the agreed upon time to discuss these results.   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  42-pound weight loss currently/19.4% TBW loss.   However, she reports jitteriness and increased irritability towards hunger with Zepbound, and finds Wegovy  more effective.  - Continue Zepbound for weight management as  prescribed by [facility] provider - Increase daily calorie intake by 150 calories, focusing on protein for adaptive thermogenesis and some increase hunger signals/feeling jittery - Incorporate running and consider using a weighted vest for exercise  Mixed hyperlipidemia Recent lab thru biometric panel at work- results showed an unusually high LDL level, inconsistent with previous results and her weight loss. She is on pravastatin  with well-controlled triglycerides and HDL levels. Suspected lab error, plans to retest. Lab Results  Component Value Date   CHOL 182 05/26/2023   CHOL 196 10/14/2022   CHOL 226 (H) 07/17/2022   Lab Results  Component Value Date   HDL 62 05/26/2023   HDL 66 10/14/2022   HDL 73 07/17/2022   Lab Results  Component Value Date   LDLCALC 108 (H) 05/26/2023   LDLCALC 118 (H) 10/14/2022   LDLCALC 142 (H) 07/17/2022   Lab Results  Component Value Date   TRIG 63 05/26/2023   TRIG 63 10/14/2022   TRIG 65 07/17/2022   Lab Results  Component Value Date   CHOLHDL 2.9 05/26/2023   CHOLHDL 3.1 07/17/2022   No results found for: LDLDIRECT She had a steady improvement in LDL, so not sure if recent lab was accurate.  Reassess with patient on GLP therapy x 1 year and continued Pravastatin .  - Re-Order lipid panel to reassess LDL levels - Continue pravastatin  therapy  Primary hypertension Blood pressure readings have shown variability between arms, with recent readings being normal. Right arm discomfort has resolved and is not indicative of major cardiovascular issues. On Losartan  50 mg daily. No SE.  - Evaluate blood pressure in both arms during future visits Continue to work on nutrition plan to promote weight loss and improve BP control.  Monitor closely with continued GLP therapy.  Recheck renal function  Vitamin D  deficiency Recent vitamin D  levels were higher than desired, and she has adjusted her intake accordingly. Low vitamin D  levels can be associated with  adiposity and may result in leptin resistance and weight gain. Also associated with fatigue.  Currently on vitamin D  supplementation without any adverse effects such as nausea, vomiting or muscle weakness.  - Order vitamin D  level to reassess current status  Runner's colitis/Irritable bowel syndrome Gastrointestinal symptoms have significantly improved with GLP therapy. Travel to [place] posed dietary challenges, leading to constipation due to lack of fruits and vegetables. - Increase fiber and water intake, possibly with chia seeds and yogurt Continue current GLP therapy with Zepbound per [facility] provider  Fatigue, unspecified/ history of low serum B 12 Reports fatigue, possibly related to adaptive thermogenesis from prolonged GLP-1 therapy. Advised to increase protein intake to address potential muscle loss and fatigue. - Increase protein intake to address fatigue - Order A1c, insulin , and thyroid function tests to evaluate potential causes of fatigue and vitamin levels- B12 and D  Follow-Up Follow-up appointment scheduled to reassess condition and review lab results. - Schedule follow-up appointment on September 30th at 9 AM - Ensure lab work is completed prior to follow-up appointment  Vitals Temp: 98.6 F (37 C) BP: 111/81 Pulse Rate: 95 SpO2:  100 %   Anthropometric Measurements Height: 5' 6 (1.676 m) Weight: 214 lb (97.1 kg) BMI (Calculated): 34.56 Weight at Last Visit: 217lb Weight Lost Since Last Visit: 3lb Weight Gained Since Last Visit: 0lb Starting Weight: 217lb Total Weight Loss (lbs): 3 lb (1.361 kg)   Body Composition  Body Fat %: 41.4 % Fat Mass (lbs): 89 lbs Muscle Mass (lbs): 119.4 lbs Total Body Water (lbs): 84.2 lbs Visceral Fat Rating : 11   Other Clinical Data Fasting: Yes Labs: Yes Today's Visit #: 9 Starting Date: 10/14/22     ASSESSMENT AND PLAN:  Diet: Lue is currently in the action stage of change. As such, her goal is to continue  with weight loss efforts. She has agreed to Category 1 Plan and + 150 calories.  Exercise: Maggie has been instructed to work up to a goal of 150 minutes of combined cardio and strengthening exercise per week and to continue exercising as is for weight loss and overall health benefits.   Behavior Modification:  We discussed the following Behavioral Modification Strategies today: increasing lean protein intake, decreasing simple carbohydrates, increasing vegetables, increase H2O intake, increase high fiber foods, no skipping meals, meal planning and cooking strategies, avoiding temptations, and planning for success. We discussed various medication options to help Jaelee with her weight loss efforts and we both agreed to continue current treatment plan.  Return in about 5 weeks (around 01/05/2024).SABRA She was informed of the importance of frequent follow up visits to maximize her success with intensive lifestyle modifications for her multiple health conditions.  Attestation Statements:   Reviewed by clinician on day of visit: allergies, medications, problem list, medical history, surgical history, family history, social history, and previous encounter notes.   Time spent on visit including pre-visit chart review and post-visit care and charting was 36 minutes.    Quirino Kakos, PA-C

## 2024-01-01 LAB — CMP14+EGFR
ALT: 18 IU/L (ref 0–32)
AST: 20 IU/L (ref 0–40)
Albumin: 4.5 g/dL (ref 3.8–4.9)
Alkaline Phosphatase: 64 IU/L (ref 49–135)
BUN/Creatinine Ratio: 19 (ref 9–23)
BUN: 17 mg/dL (ref 6–24)
Bilirubin Total: 0.4 mg/dL (ref 0.0–1.2)
CO2: 23 mmol/L (ref 20–29)
Calcium: 9.5 mg/dL (ref 8.7–10.2)
Chloride: 103 mmol/L (ref 96–106)
Creatinine, Ser: 0.89 mg/dL (ref 0.57–1.00)
Globulin, Total: 2.2 g/dL (ref 1.5–4.5)
Glucose: 69 mg/dL — ABNORMAL LOW (ref 70–99)
Potassium: 4.1 mmol/L (ref 3.5–5.2)
Sodium: 140 mmol/L (ref 134–144)
Total Protein: 6.7 g/dL (ref 6.0–8.5)
eGFR: 77 mL/min/1.73 (ref >59)

## 2024-01-01 LAB — LIPID PANEL WITH LDL/HDL RATIO
Cholesterol, Total: 193 mg/dL (ref 100–199)
HDL: 72 mg/dL (ref >39)
LDL Chol Calc (NIH): 110 mg/dL — ABNORMAL HIGH (ref 0–99)
LDL/HDL Ratio: 1.5 ratio (ref 0.0–3.2)
Triglycerides: 57 mg/dL (ref 0–149)
VLDL Cholesterol Cal: 11 mg/dL (ref 5–40)

## 2024-01-01 LAB — VITAMIN B12: Vitamin B-12: 819 pg/mL (ref 232–1245)

## 2024-01-01 LAB — VITAMIN D 25 HYDROXY (VIT D DEFICIENCY, FRACTURES): Vit D, 25-Hydroxy: 79.6 ng/mL (ref 30.0–100.0)

## 2024-01-01 LAB — TSH: TSH: 1.84 u[IU]/mL (ref 0.450–4.500)

## 2024-01-01 LAB — INSULIN, RANDOM: INSULIN: 8.4 u[IU]/mL (ref 2.6–24.9)

## 2024-01-01 LAB — HEMOGLOBIN A1C
Est. average glucose Bld gHb Est-mCnc: 100 mg/dL
Hgb A1c MFr Bld: 5.1 % (ref 4.8–5.6)

## 2024-01-03 NOTE — Progress Notes (Unsigned)
 SUBJECTIVE: Discussed the use of AI scribe software for clinical note transcription with the patient, who gave verbal consent to proceed.  Chief Complaint: Obesity  Interim History: She is up 4 lbs since her last visit.  Down 38 lbs overall since 10/14/22 TBW Loss of 14.8%   Muscle mass maintained Adipose mass slightly increased 1.8 lbs  Down 60 lbs from peak weight TBW loss of 21.6%  Dawn Singh is here to discuss her progress with her obesity treatment plan. She is on the Category 2 Plan and following a lower carbohydrate, vegetable and lean protein rich diet plan and states she is following her eating plan approximately 70 % of the time. She states she is exercising running 4 miles 7 times per week.  Manasi Dishon is a 53 year old female with obesity, mixed hyperlipidemia, and hypertension who presents for follow-up on her obesity treatment plan.  She had been on Zepbound 7.5 mg weekly for obesity management and this was recently increased to 10 mg weekly by [employer] [facility] provider.  She reports recent travel, which she feels may have impacted her weight. She notes that Zepbound is not as effective as Wegovy , which she used previously.  For her mixed hyperlipidemia, she is taking Pravachol  10 mg daily. Her recent cholesterol numbers include triglycerides at 57 and increased HDL levels. She recalls an abnormally high total cholesterol reading of 238 in August with biometric measures, which has since normalized/improved on her follow up labs.  She is on Cozaar  50 mg daily for hypertension. She reports no symptoms of hypotension. She monitors her blood pressure occasionally at home.  She manages vitamin D  and B12 deficiencies with cholecalciferol 5000 units daily and B complex vitamins, respectively. She reports no adverse symptoms related to her vitamin D  supplementation. No issues with nausea, vomiting, or muscle problems related to her vitamin D  supplementation.  She experiences sleep  disturbances, possibly related to menopause and work stress, and occasionally uses melatonin to aid sleep. She travels frequently for work, which impacts her diet and routine. 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 Lean body mass has increased, and adiposity has decreased from 43% to 41.6%. Challenges with weight fluctuations due to travel and dietary changes are noted. Zepbound is less effective than Wegovy , and she is considering switching back depending on next year's insurance plan. - Continue Zepbound 10 mg weekly per [employer] [facility] provider - Monitor weight and body composition - Encourage adequate protein intake - Consider switching back to Wegovy  if insurance allows - May want to consider repeating IC at this point over the next couple of months.   Mixed hyperlipidemia Mixed hyperlipidemia is managed with Pravachol  10 mg daily. Recent lab results show improved cholesterol levels with increased HDL due to exercise. LDL remains slightly elevated, but the overall LDL  to HDL ratio is good, reducing cardiovascular risk. Labs reviewed.  Lab Results  Component Value Date   CHOL 193 12/31/2023   CHOL 182 05/26/2023   CHOL 196 10/14/2022   Lab Results  Component Value Date   HDL 72 12/31/2023   HDL 62 05/26/2023   HDL 66 10/14/2022   Lab Results  Component Value Date   LDLCALC 110 (H) 12/31/2023   LDLCALC 108 (H) 05/26/2023   LDLCALC 118  (H) 10/14/2022   Lab Results  Component Value Date   TRIG 57 12/31/2023   TRIG 63 05/26/2023   TRIG 63 10/14/2022   Lab Results  Component Value Date   CHOLHDL 2.9 05/26/2023   CHOLHDL 3.1 07/17/2022   No results found for: LDLDIRECT  LDL remains slightly above goal, but HDL very good at 72 and trig also very good at 57.  Not sure of accuracy of recent biometric labs.  Continue Pravachol .  Continue Zepbound Continue to work on Engineer, technical sales -decreasing simple carbohydrates, increasing lean proteins, decreasing saturated fats and cholesterol , avoiding trans fats and exercise as able to promote weight loss, improve lipids and decrease cardiovascular risks. - Monitor cholesterol levels periodically  Hypertension Hypertension is managed with Cozaar  50 mg daily. Blood pressure is slightly low at 103/70 mmHg, but there are no symptoms of hypotension. She is advised to monitor blood pressure, especially during stress, and maintain adequate hydration. BP Readings from Last 3 Encounters:  01/04/24 103/70  12/01/23 111/81  08/04/23 103/70   Lab Results  Component Value Date   NA 140 12/31/2023   CL 103 12/31/2023   K 4.1 12/31/2023   CO2 23 12/31/2023   BUN 17 12/31/2023   CREATININE 0.89 12/31/2023   EGFR 77 12/31/2023   CALCIUM  9.5 12/31/2023   ALBUMIN 4.5 12/31/2023   GLUCOSE 69 (L) 12/31/2023    - Continue Cozaar  50 mg daily - Monitor blood pressure regularly - Ensure adequate hydration (at least 80 ounces of fluid daily) Continue to work on nutrition plan to promote weight loss and improve BP control.    Insomnia Insomnia is likely related to menopause and work-related stress. She uses melatonin occasionally to aid sleep. Strategies to improve sleep include maintaining a sleep journal and using relaxation techniques. - Consider sustained release melatonin (3-5 mg) - Use relaxation techniques and white noise for sleep - Maintain a sleep journal to manage  stress-related thoughts  Menopausal symptoms Menopausal symptoms, including sleep disturbances, are present. She is experiencing brain fog and stress, which may be contributing to sleep issues. - Monitor menopausal symptoms- consider follow up with GYN -Lifestyle modifications to manage stress  Vitamin D  deficiency Vitamin D  levels are slightly above goal due to supplementation and sun exposure. She is advised to adjust the supplementation schedule to allow levels to decrease slightly. On OTC vitamin D  5000 units once daily. No N/V or muscle weakness with vitamin D .  Last vitamin D  Lab Results  Component Value Date   VD25OH 79.6 12/31/2023    - Adjust cholecalciferol intake to every Monday, Wednesday, and Friday  Low vitamin D  levels can be associated with adiposity and may result in leptin resistance and weight gain. Also associated with fatigue.  Currently on vitamin D  supplementation without any adverse effects such as nausea, vomiting or muscle weakness.  Recheck vitamin D  levels several times yearly to optimize supplementation.   Vitamin B12 deficiency Vitamin B12 deficiency is managed with B complex vitamins. Issues with current management are reported. -  Continue B complex vitamins  Vitals Temp: 98.3 F (36.8 C) BP: 103/70 Pulse Rate: 81 SpO2: 99 %   Anthropometric Measurements Height: 5' 6 (1.676 m) Weight: 218 lb (98.9 kg) BMI (Calculated): 35.2 Weight at Last Visit: 214 lb Weight Lost Since Last Visit: 0 Weight Gained Since Last Visit: 4 lb Starting Weight: 256 lb Total Weight Loss (lbs): 38 lb (17.2 kg)   Body Composition  Body Fat %: 41.6 % Fat Mass (lbs): 90.8 lbs Muscle Mass (lbs): 121.2 lbs Total Body Water (lbs): 88.2 lbs Visceral Fat Rating : 11   Other Clinical Data Fasting: No Labs: No Today's Visit #: 10 Starting Date: 10/14/22     ASSESSMENT AND PLAN:  Diet: Kenyana is currently in the action stage of change. As such, her goal is  to continue with weight loss efforts. She has agreed to Category 2 Plan.  Exercise: Safa has been instructed to continue exercising as is for weight loss and overall health benefits.   Behavior Modification:  We discussed the following Behavioral Modification Strategies today: increasing lean protein intake, decreasing simple carbohydrates, increasing vegetables, increase H2O intake, increase high fiber foods, meal planning and cooking strategies, travel eating strategies, avoiding temptations, and planning for success. We discussed various medication options to help Milanna with her weight loss efforts and we both agreed to continue current treatment plan.  Return in about 4 weeks (around 02/01/2024).SABRA She was informed of the importance of frequent follow up visits to maximize her success with intensive lifestyle modifications for her multiple health conditions.  Attestation Statements:   Reviewed by clinician on day of visit: allergies, medications, problem list, medical history, surgical history, family history, social history, and previous encounter notes.   Time spent on visit including pre-visit chart review and post-visit care and charting was 39 minutes.    Aitanna Haubner, PA-C

## 2024-01-04 ENCOUNTER — Encounter (INDEPENDENT_AMBULATORY_CARE_PROVIDER_SITE_OTHER): Payer: Self-pay | Admitting: Physician Assistant

## 2024-01-04 ENCOUNTER — Ambulatory Visit (INDEPENDENT_AMBULATORY_CARE_PROVIDER_SITE_OTHER): Admitting: Physician Assistant

## 2024-01-04 VITALS — BP 103/70 | HR 81 | Temp 98.3°F | Ht 66.0 in | Wt 218.0 lb

## 2024-01-04 DIAGNOSIS — E559 Vitamin D deficiency, unspecified: Secondary | ICD-10-CM

## 2024-01-04 DIAGNOSIS — E669 Obesity, unspecified: Secondary | ICD-10-CM

## 2024-01-04 DIAGNOSIS — E782 Mixed hyperlipidemia: Secondary | ICD-10-CM | POA: Diagnosis not present

## 2024-01-04 DIAGNOSIS — I10 Essential (primary) hypertension: Secondary | ICD-10-CM

## 2024-01-04 DIAGNOSIS — N951 Menopausal and female climacteric states: Secondary | ICD-10-CM

## 2024-01-04 DIAGNOSIS — E538 Deficiency of other specified B group vitamins: Secondary | ICD-10-CM | POA: Diagnosis not present

## 2024-01-04 DIAGNOSIS — G4709 Other insomnia: Secondary | ICD-10-CM

## 2024-01-04 DIAGNOSIS — K580 Irritable bowel syndrome with diarrhea: Secondary | ICD-10-CM

## 2024-01-04 DIAGNOSIS — K589 Irritable bowel syndrome without diarrhea: Secondary | ICD-10-CM

## 2024-01-04 DIAGNOSIS — Z6835 Body mass index (BMI) 35.0-35.9, adult: Secondary | ICD-10-CM

## 2024-02-01 ENCOUNTER — Ambulatory Visit (INDEPENDENT_AMBULATORY_CARE_PROVIDER_SITE_OTHER): Admitting: Physician Assistant

## 2024-02-08 NOTE — Progress Notes (Unsigned)
 SUBJECTIVE: Discussed the use of AI scribe software for clinical note transcription with the patient, who gave verbal consent to proceed.  Chief Complaint: Obesity  Interim History: She is down 3 lbs since her last visit.  Down 41 lbs overall TBW loss of 16.0%  Bio impedence scale today: Muscle mass + 1.8 lbs Adipose mass - 4.8 lbs !!  Overall body adipose improved to 39.9%  Dawn Singh is here to discuss her progress with her obesity treatment plan. She is on the Category 1 Plan and states she is following her eating plan approximately 70 % of the time. She states she is exercising walking/running 60 minutes 7 times per week.  Dawn Singh is a 53 year old female who presents for follow-up of her obesity treatment plan.  She is currently on Zepbound 10 mg weekly for weight management thru [facility] provider. Since her last visit, she has lost an additional three pounds, totaling a weight loss of forty-one pounds. Her current weight is 215 pounds with a BMI of 34.8. She adheres to a category one plan 70% of the time, tracking her calories and macros, consuming whole foods, and meeting her protein and water intake goals. She is not skipping meals and sleeps seven to nine hours per night. Her physical activity includes running and walking for sixty minutes seven days a week.  She experiences nausea, which she attributes to recent travel and changes in her diet, including a decrease in raw vegetable intake. She usually consumes a large amount of raw vegetables daily but was unable to maintain this due to travel. She did not usually have difficulty with constipation. She has since returned to her usual diet, including adding chia seeds to Greek yogurt, which has helped alleviate her symptoms. She also experienced sleep disturbances and used melatonin to aid her sleep, which improved her condition.   She has had sweet cravings since starting Zepbound, which she did not experience before  while on Wegovy , despite her fasting glucose being low. She has managed these cravings by adjusting her diet, including adding an extra hard-boiled egg and consuming zero sugar Greek yogurts.  Her past medical history includes hypertension, hyperlipidemia, and vitamin D  and B deficiencies. She has been managing these conditions alongside her obesity treatment. During the review of symptoms, no significant side effects from Zepbound were reported except for the nausea and sweet cravings mentioned earlier. She has had increases in muscle mass and decreases in visceral adipose tissue on prior body composition measurements.  Pharmacotherapy: Wegovy - 08/2022-stopped 04/2023 due to insurance not covering   Zepbound 2.5 mg started ~ 06/2023/currently on 10 mg weekly since 12/2023- Rx per [employer] [employer] provider  OBJECTIVE: Visit Diagnoses: Problem List Items Addressed This Visit     Vitamin D  deficiency   Mixed hyperlipidemia - Primary   Primary hypertension   Vitamin B12 deficiency   Other Visit Diagnoses       Insomnia associated with menopause         Obesity (HCC)- Starting BMI 10/14/22 41.34         BMI 34.0-34.9,adult Current BMI 34.8         Obesity  Obesity with a current weight of 215 lbs and BMI of 34.8. Total weight loss of 41 lbs since starting treatment. Following a category one plan 70% of the time, tracking calories and macros, consuming whole foods, adequate protein, and water. Engaging in 60 minutes of running and walking daily.  Recent increase in muscle mass and decrease  in adipose tissue. Experiencing nausea and constipation, likely related to decreased fiber intake during travel. Sweet cravings noted, but fasting glucose remains low. Considering potential need for increased caloric intake due to increased physical activity. - Continue Zepbound 10 mg weekly per [employer] provider. - Maintain current dietary and exercise regimen. - Increase fiber intake to manage constipation. - Consider magnesium  supplement (Calm) for sleep and constipation, starting with a modest dose. - Will schedule indirect telemetry test on December 9th at 8 AM to assess caloric needs. - Advised fasting and avoiding caffeine before the telemetry test. - Discussed Thanksgiving dietary strategy: half plate protein, portion control, and mindful eating.   Hyperlipidemia LDL is not at goal. Medication(s): Pravachol  10 mg daily. No reported SE with pravachol .  Cardiovascular risk factors: dyslipidemia, hypertension, and obesity (BMI >= 30 kg/m2)  Lab Results  Component Value Date   CHOL 193 12/31/2023   HDL 72 12/31/2023   LDLCALC 110 (H) 12/31/2023   TRIG 57 12/31/2023   CHOLHDL 2.9 05/26/2023   CHOLHDL 3.1 07/17/2022   Lab Results  Component Value Date   ALT 18 12/31/2023   AST 20 12/31/2023   ALKPHOS 64 12/31/2023   BILITOT 0.4 12/31/2023   The 10-year ASCVD risk score (Arnett DK, et al., 2019) is: 1.1%   Values used to calculate the score:     Age: 1 years     Clincally relevant sex: Female     Is Non-Hispanic African American: No     Diabetic: No     Tobacco smoker: No     Systolic Blood Pressure: 106 mmHg     Is BP treated: Yes     HDL Cholesterol: 72 mg/dL     Total Cholesterol: 193 mg/dL  Plan: Continue Pravachol  10 mg daily Continue Zepbound to reduce CV risks Continue to work on nutrition plan -decreasing simple carbohydrates, increasing lean proteins, decreasing saturated fats and cholesterol , avoiding trans fats and exercise as able to promote weight loss, improve lipids and decrease cardiovascular risks.  Hypertension Hypertension well controlled, asymptomatic, and no significant medication side effects noted.  Medication(s): losartan  50 mg daily  BP Readings from Last 3 Encounters:  02/09/24 106/72  01/04/24 103/70  12/01/23 111/81   Lab Results  Component Value Date   CREATININE 0.89 12/31/2023   CREATININE 0.84 05/26/2023   CREATININE 0.72 10/14/2022   No results  found for: GFR  Plan: Continue losartan  50 mg daily.  Continue to work on nutrition plan to promote weight loss and improve BP control.   Insomnia Likely related to menopause. Reports early am awakening with difficulty returning to sleep.  Plan:  Continue strategies to keep cool while sleeping.  Consider use of Calm magnesium for both sleep and may help with constipation as well.   Vitamin D  and B deficiencies  On B complex vitamins and vitamin D  5000 units daily.  Last vitamin D  Lab Results  Component Value Date   VD25OH 79.6 12/31/2023   Lab Results  Component Value Date   VITAMINB12 819 12/31/2023   Plan:  Low vitamin D  levels can be associated with adiposity and may result in leptin resistance and weight gain. Also associated with fatigue.  Currently on vitamin D  supplementation without any adverse effects such as nausea, vomiting or muscle weakness.  Continue B complex vitamins and vitamin D  5000 units daily.    Vitals Temp: 98.2 F (36.8 C) BP: 106/72 Pulse Rate: 87 SpO2: 100 %   Anthropometric Measurements Height:  5' 6 (1.676 m) Weight: 215 lb (97.5 kg) BMI (Calculated): 34.72 Weight at Last Visit: 218 lb Weight Lost Since Last Visit: 3 lb Weight Gained Since Last Visit: 0 Starting Weight: 256 lb Total Weight Loss (lbs): 41 lb (18.6 kg)   Body Composition  Body Fat %: 39.9 % Fat Mass (lbs): 86 lbs Muscle Mass (lbs): 123 lbs Total Body Water (lbs): 86.6 lbs Visceral Fat Rating : 11   Other Clinical Data Fasting: Yes Labs: No Today's Visit #: 11 Starting Date: 10/14/22     ASSESSMENT AND PLAN:  Diet: Dawn Singh is currently in the action stage of change. As such, her goal is to continue with weight loss efforts. She has agreed to Category 1 Plan.  Exercise: Dawn Singh has been instructed to continue exercising as is for weight loss and overall health benefits.   Behavior Modification:  We discussed the following Behavioral Modification  Strategies today: increasing lean protein intake, decreasing simple carbohydrates, increasing vegetables, increase H2O intake, meal planning and cooking strategies, holiday eating strategies, avoiding temptations, and planning for success. We discussed various medication options to help Dawn Singh with her weight loss efforts and we both agreed to continue current treatment plan.  Return in about 5 weeks (around 03/15/2024).Dawn Singh She was informed of the importance of frequent follow up visits to maximize her success with intensive lifestyle modifications for her multiple health conditions.  Attestation Statements:   Reviewed by clinician on day of visit: allergies, medications, problem list, medical history, surgical history, family history, social history, and previous encounter notes.   Time spent on visit including pre-visit chart review and post-visit care and charting was 42 minutes.    Daveigh Batty, PA-C

## 2024-02-09 ENCOUNTER — Encounter (INDEPENDENT_AMBULATORY_CARE_PROVIDER_SITE_OTHER): Payer: Self-pay | Admitting: Physician Assistant

## 2024-02-09 ENCOUNTER — Ambulatory Visit (INDEPENDENT_AMBULATORY_CARE_PROVIDER_SITE_OTHER): Payer: Self-pay | Admitting: Physician Assistant

## 2024-02-09 VITALS — BP 106/72 | HR 87 | Temp 98.2°F | Ht 66.0 in | Wt 215.0 lb

## 2024-02-09 DIAGNOSIS — N951 Menopausal and female climacteric states: Secondary | ICD-10-CM

## 2024-02-09 DIAGNOSIS — I10 Essential (primary) hypertension: Secondary | ICD-10-CM | POA: Diagnosis not present

## 2024-02-09 DIAGNOSIS — G4700 Insomnia, unspecified: Secondary | ICD-10-CM

## 2024-02-09 DIAGNOSIS — E782 Mixed hyperlipidemia: Secondary | ICD-10-CM

## 2024-02-09 DIAGNOSIS — E785 Hyperlipidemia, unspecified: Secondary | ICD-10-CM

## 2024-02-09 DIAGNOSIS — E538 Deficiency of other specified B group vitamins: Secondary | ICD-10-CM

## 2024-02-09 DIAGNOSIS — E559 Vitamin D deficiency, unspecified: Secondary | ICD-10-CM

## 2024-02-09 DIAGNOSIS — Z6834 Body mass index (BMI) 34.0-34.9, adult: Secondary | ICD-10-CM

## 2024-03-14 ENCOUNTER — Ambulatory Visit (INDEPENDENT_AMBULATORY_CARE_PROVIDER_SITE_OTHER): Admitting: Physician Assistant

## 2024-05-11 ENCOUNTER — Encounter: Payer: Self-pay | Admitting: Family

## 2024-05-11 ENCOUNTER — Ambulatory Visit: Admitting: Family

## 2024-05-11 VITALS — BP 130/84 | HR 73 | Temp 97.8°F | Ht 66.0 in | Wt 225.0 lb

## 2024-05-11 DIAGNOSIS — Z23 Encounter for immunization: Secondary | ICD-10-CM

## 2024-05-11 DIAGNOSIS — I10 Essential (primary) hypertension: Secondary | ICD-10-CM

## 2024-05-11 DIAGNOSIS — Z975 Presence of (intrauterine) contraceptive device: Secondary | ICD-10-CM

## 2024-05-11 DIAGNOSIS — E538 Deficiency of other specified B group vitamins: Secondary | ICD-10-CM

## 2024-05-11 DIAGNOSIS — E782 Mixed hyperlipidemia: Secondary | ICD-10-CM

## 2024-05-11 DIAGNOSIS — E559 Vitamin D deficiency, unspecified: Secondary | ICD-10-CM

## 2024-05-11 DIAGNOSIS — R635 Abnormal weight gain: Secondary | ICD-10-CM

## 2024-05-11 DIAGNOSIS — Z1231 Encounter for screening mammogram for malignant neoplasm of breast: Secondary | ICD-10-CM

## 2024-05-11 DIAGNOSIS — E661 Drug-induced obesity: Secondary | ICD-10-CM

## 2024-05-11 NOTE — Progress Notes (Signed)
 "  Subjective:  Patient ID: Dawn Singh, female    DOB: [birth year]  Age: 54 y.o. MRN: [id number]  Patient Care Team: Corwin Antu, FNP as PCP - General (Family Medicine)   CC:  Chief Complaint  Patient presents with   Annual Exam    HPI Dawn Singh is a 54 y.o. female who presents today for an annual physical exam. She reports consuming a general diet. Working on walking often with weighted vest She generally feels well. She reports sleeping fairly well. She does not have additional problems to discuss today.   Vision:Within last year Dental:Receives regular dental care  Mammogram: 07/06/23 Last pap: 2024 with gynecology  Colonoscopy:04/03/2928  Pt is with acute concerns.  Discussed the use of AI scribe software for clinical note transcription with the patient, who gave verbal consent to proceed.  History of Present Illness Dawn Singh is a 53 year old female who presents for an annual physical exam.  [facility] but missed her December appointment due to work and travel commitments. Her weight has been stable with no significant progress, and she has been gaining weight according to her scale. 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  She has experienced a return of her menstrual periods over the last three months, despite having an IUD since 2021, which previously resulted in almost nonexistent periods. She notes spotting in December, January, and again recently. Frequent travel has been stressful and disruptive to her routine.  She consistently experiences low blood sugar levels and follows a routine of eating every two to three hours during waking hours to manage hypoglycemia. She has not had a sleep study but wakes up at 2 to 3 AM, leading to daytime sleepiness. She does not snore according to reports.  She is currently on pravastatin  and Zepbound. She started Zepbound last March  and had to restart from a lower dose, reaching 12.5 mg in December. She now experiences some appetite suppression. She wears a weighted vest during walks to maintain muscle mass and has gained muscle, which she attributes to this practice.  She travels frequently for work, including international trips, and finds it challenging to maintain a consistent exercise routine. She walks four miles daily. Her last mammogram was in April 2025, and she is up to date with her eye and dental exams. She had a colonoscopy in 2021, which was normal.    Advanced Directives Patient does not have advanced directives   DEPRESSION SCREENING    05/11/2024   11:02 AM 11/18/2022    8:25 AM 08/17/2022    1:00 PM 06/08/2022   11:28 AM 06/08/2022   11:16 AM  PHQ 2/9 Scores  PHQ - 2 Score 0 0 0 0 0  PHQ- 9 Score 0 1  2  2  2       Data saved with a previous flowsheet row definition     ROS: Negative unless specifically indicated above in HPI.   Current Medications[1]    Objective:    BP 130/84 (BP Location: Left Arm, Patient Position: Sitting, Cuff Size: Large)   Pulse 73   Temp 97.8 F (36.6 C) (Temporal)   Ht 5' 6 (1.676 m)   Wt 225 lb (102.1 kg)   SpO2 99%   BMI 36.32 kg/m   BP Readings from Last 3 Encounters:  05/11/24 130/84  02/09/24 106/72  01/04/24 103/70  Physical Exam Vitals reviewed.  Constitutional:      General: She is not in acute distress.    Appearance: Normal appearance. She is normal weight. She is not ill-appearing.  HENT:     Head: Normocephalic.     Right Ear: Tympanic membrane normal.     Left Ear: Tympanic membrane normal.     Nose: Nose normal.     Mouth/Throat:     Mouth: Mucous membranes are moist.  Eyes:     Extraocular Movements: Extraocular movements intact.     Pupils: Pupils are equal, round, and reactive to light.  Cardiovascular:     Rate and Rhythm: Normal rate and regular rhythm.  Pulmonary:     Effort: Pulmonary effort is normal.     Breath  sounds: Normal breath sounds.  Abdominal:     General: Abdomen is flat. Bowel sounds are normal.     Palpations: Abdomen is soft.     Tenderness: There is no guarding or rebound.  Musculoskeletal:        General: Normal range of motion.     Cervical back: Normal range of motion.  Skin:    General: Skin is warm.     Capillary Refill: Capillary refill takes less than 2 seconds.  Neurological:     General: No focal deficit present.     Mental Status: She is alert.  Psychiatric:        Mood and Affect: Mood normal.        Behavior: Behavior normal.        Thought Content: Thought content normal.        Judgment: Judgment normal.     Wt Readings from Last 3 Encounters:  05/11/24 225 lb (102.1 kg)  02/09/24 215 lb (97.5 kg)  01/04/24 218 lb (98.9 kg)     Results Labs TSH (12/2023): Within normal limits Cholesterol panel: Within normal limits  Radiology Mammogram (07/2023): Normal  Diagnostic Colonoscopy (2021): Normal      Assessment & Plan:   Assessment and Plan Assessment & Plan Class 2 drug-induced obesity Obesity management is ongoing with Zepbound, though she reports challenges with the medication's effectiveness and insurance coverage. She has been using a weighted vest during walks and is considering strength training to build muscle mass. She is experiencing some hunger suppression with the current dose of Zepbound. - Continue Zepbound as prescribed. - Encouraged strength training and muscle building exercises. - Ordered full thyroid panel to rule out thyroid dysfunction as a contributing factor to weight gain.  Mixed hyperlipidemia Cholesterol levels are well-controlled with pravastatin . - Continue pravastatin  as prescribed.  Vitamin D  deficiency Vitamin D  levels are improving with supplementation. She is on 2000 IU of vitamin D  daily. - Continue vitamin D  supplementation at 2000 IU daily.  Menstrual changes with intrauterine device She reports  resumption of menstrual periods after a long period of amenorrhea with an IUD. The IUD is a seven-year device, and the changes may be related to stress and travel. - Continue to monitor menstrual cycle for any further changes.  Hypoglycemia She experiences hypoglycemia and manages it by eating every two to three hours. - Continue current dietary management for hypoglycemia.  General Health Maintenance She is up to date with dental and eye exams. Mammogram is due after April 1st. She is not due for a Pap smear. She has received the shingles vaccine and is considering the pneumonia vaccine due to travel. - Ordered mammogram after April 1st. - Administered pneumonia vaccine  today. - Ensure follow-up for Pap smear as needed. -Patient Counseling(The following topics were reviewed):  Preventative care handout given to pt  Health maintenance and immunizations reviewed. Please refer to Health maintenance section. Pt advised on safe sex, wearing seatbelts in car, and proper nutrition labwork ordered today for annual Dental health: Discussed importance of regular tooth brushing, flossing, and dental visits.         Follow-up: Return in about 1 year (around 05/11/2025) for f/u CPE.   Ginger Patrick, FNP     [1]  Current Outpatient Medications:    Cholecalciferol (VITAMIN D3) 50 MCG (2000 UT) capsule, Take 2,000 Units by mouth daily., Disp: , Rfl:    tirzepatide (ZEPBOUND) 12.5 MG/0.5ML Pen, Inject 12.5 mg into the skin once a week., Disp: , Rfl:    b complex vitamins capsule, Take 2 capsules by mouth daily., Disp: , Rfl:    ibuprofen (ADVIL) 200 MG tablet, Take 400 mg by mouth every 6 (six) hours as needed for headache or moderate pain., Disp: , Rfl:    losartan  (COZAAR ) 50 MG tablet, Take 1 tablet (50 mg total) by mouth daily., Disp: 90 tablet, Rfl: 3   Melatonin 1 MG CAPS, Take 1 mg by mouth at bedtime. PRN when traveling, Disp: , Rfl:    pravastatin  (PRAVACHOL ) 10 MG tablet, Take 1  tablet (10 mg total) by mouth daily., Disp: 90 tablet, Rfl: 3  "

## 2024-05-11 NOTE — Patient Instructions (Signed)
" °  Glucose found to be incidentally low. All this means is you have periods of low blood sugar, nothing much else to do other than make sure you eat small snacks every 2-3 hours to keep your blood sugar level and from dropping too low. Sometimes when you have low blood sugar you can feel shaky and or clammy and eating a snack with protein/carb combination will help.    ------------------------------------  "

## 2024-05-12 LAB — COMPREHENSIVE METABOLIC PANEL WITH GFR
ALT: 20 [IU]/L (ref 0–32)
AST: 23 [IU]/L (ref 0–40)
Albumin: 4.7 g/dL (ref 3.8–4.9)
Alkaline Phosphatase: 67 [IU]/L (ref 49–135)
BUN/Creatinine Ratio: 26 — ABNORMAL HIGH (ref 9–23)
BUN: 19 mg/dL (ref 6–24)
Bilirubin Total: 0.4 mg/dL (ref 0.0–1.2)
CO2: 24 mmol/L (ref 20–29)
Calcium: 9.6 mg/dL (ref 8.7–10.2)
Chloride: 101 mmol/L (ref 96–106)
Creatinine, Ser: 0.74 mg/dL (ref 0.57–1.00)
Globulin, Total: 2 g/dL (ref 1.5–4.5)
Glucose: 78 mg/dL (ref 70–99)
Potassium: 4.1 mmol/L (ref 3.5–5.2)
Sodium: 141 mmol/L (ref 134–144)
Total Protein: 6.7 g/dL (ref 6.0–8.5)
eGFR: 97 mL/min/{1.73_m2}

## 2024-05-12 LAB — CBC
Hematocrit: 45.8 % (ref 34.0–46.6)
Hemoglobin: 15 g/dL (ref 11.1–15.9)
MCH: 29.3 pg (ref 26.6–33.0)
MCHC: 32.8 g/dL (ref 31.5–35.7)
MCV: 90 fL (ref 79–97)
Platelets: 254 10*3/uL (ref 150–450)
RBC: 5.12 x10E6/uL (ref 3.77–5.28)
RDW: 12.9 % (ref 11.7–15.4)
WBC: 6.8 10*3/uL (ref 3.4–10.8)

## 2024-05-12 LAB — MICROALBUMIN / CREATININE URINE RATIO
Creatinine, Urine: 40.3 mg/dL
Microalb/Creat Ratio: <7 mg/g{creat} (ref 0–29)
Microalbumin, Urine: <3 ug/mL

## 2024-05-12 LAB — T3, FREE: T3, Free: 3.2 pg/mL (ref 2.0–4.4)

## 2024-05-12 LAB — TSH: TSH: 1.32 u[IU]/mL (ref 0.450–4.500)

## 2024-05-12 LAB — T4, FREE: Free T4: 1.27 ng/dL (ref 0.82–1.77)

## 2024-05-12 LAB — VITAMIN D 25 HYDROXY (VIT D DEFICIENCY, FRACTURES): Vit D, 25-Hydroxy: 64.3 ng/mL (ref 30.0–100.0)

## 2024-07-07 ENCOUNTER — Encounter
